# Patient Record
Sex: Female | Born: 1970 | Race: White | Hispanic: Yes | Smoking: Former smoker
Health system: Southern US, Community
[De-identification: ages and names within clinical notes are randomized; demographics above are authoritative.]

## PROBLEM LIST (undated history)

## (undated) DIAGNOSIS — R7303 Prediabetes: Secondary | ICD-10-CM

## (undated) DIAGNOSIS — G039 Meningitis, unspecified: Secondary | ICD-10-CM

## (undated) DIAGNOSIS — I729 Aneurysm of unspecified site: Secondary | ICD-10-CM

## (undated) DIAGNOSIS — E7800 Pure hypercholesterolemia, unspecified: Secondary | ICD-10-CM

## (undated) HISTORY — DX: Meningitis, unspecified: G03.9

## (undated) HISTORY — DX: Aneurysm of unspecified site: I72.9

## (undated) HISTORY — DX: Pure hypercholesterolemia, unspecified: E78.00

## (undated) HISTORY — DX: Prediabetes: R73.03

---

## 1997-10-07 ENCOUNTER — Encounter (HOSPITAL_COMMUNITY): Admission: RE | Admit: 1997-10-07 | Discharge: 1997-10-11 | Payer: Self-pay | Admitting: *Deleted

## 1997-10-08 ENCOUNTER — Inpatient Hospital Stay (HOSPITAL_COMMUNITY): Admission: AD | Admit: 1997-10-08 | Discharge: 1997-10-13 | Payer: Self-pay | Admitting: *Deleted

## 2003-08-19 ENCOUNTER — Emergency Department (HOSPITAL_COMMUNITY): Admission: EM | Admit: 2003-08-19 | Discharge: 2003-08-19 | Payer: Self-pay | Admitting: Emergency Medicine

## 2003-09-03 ENCOUNTER — Emergency Department (HOSPITAL_COMMUNITY): Admission: EM | Admit: 2003-09-03 | Discharge: 2003-09-04 | Payer: Self-pay | Admitting: Emergency Medicine

## 2004-08-05 ENCOUNTER — Emergency Department (HOSPITAL_COMMUNITY): Admission: EM | Admit: 2004-08-05 | Discharge: 2004-08-05 | Payer: Self-pay | Admitting: Emergency Medicine

## 2004-11-27 ENCOUNTER — Encounter (INDEPENDENT_AMBULATORY_CARE_PROVIDER_SITE_OTHER): Payer: Self-pay | Admitting: *Deleted

## 2004-11-27 LAB — CONVERTED CEMR LAB

## 2004-12-06 ENCOUNTER — Ambulatory Visit: Payer: Self-pay | Admitting: Family Medicine

## 2004-12-14 ENCOUNTER — Ambulatory Visit: Payer: Self-pay | Admitting: Family Medicine

## 2005-01-23 ENCOUNTER — Ambulatory Visit: Payer: Self-pay | Admitting: Family Medicine

## 2005-01-25 ENCOUNTER — Ambulatory Visit (HOSPITAL_COMMUNITY): Admission: RE | Admit: 2005-01-25 | Discharge: 2005-01-25 | Payer: Self-pay | Admitting: Internal Medicine

## 2005-02-19 ENCOUNTER — Ambulatory Visit: Payer: Self-pay | Admitting: Family Medicine

## 2005-02-26 ENCOUNTER — Ambulatory Visit: Payer: Self-pay | Admitting: Family Medicine

## 2005-03-05 ENCOUNTER — Ambulatory Visit: Payer: Self-pay | Admitting: Sports Medicine

## 2005-03-19 ENCOUNTER — Ambulatory Visit: Payer: Self-pay | Admitting: Sports Medicine

## 2005-04-23 ENCOUNTER — Ambulatory Visit: Payer: Self-pay | Admitting: Sports Medicine

## 2005-04-30 ENCOUNTER — Ambulatory Visit: Payer: Self-pay | Admitting: Obstetrics & Gynecology

## 2005-05-09 ENCOUNTER — Ambulatory Visit: Payer: Self-pay | Admitting: Family Medicine

## 2005-05-12 ENCOUNTER — Ambulatory Visit: Payer: Self-pay | Admitting: Obstetrics & Gynecology

## 2005-05-12 ENCOUNTER — Inpatient Hospital Stay (HOSPITAL_COMMUNITY): Admission: AD | Admit: 2005-05-12 | Discharge: 2005-05-15 | Payer: Self-pay | Admitting: Obstetrics & Gynecology

## 2005-05-12 ENCOUNTER — Ambulatory Visit: Payer: Self-pay | Admitting: Family Medicine

## 2005-05-18 ENCOUNTER — Inpatient Hospital Stay (HOSPITAL_COMMUNITY): Admission: AD | Admit: 2005-05-18 | Discharge: 2005-05-18 | Payer: Self-pay | Admitting: Family Medicine

## 2005-06-28 ENCOUNTER — Ambulatory Visit: Payer: Self-pay | Admitting: Sports Medicine

## 2006-09-27 ENCOUNTER — Encounter (INDEPENDENT_AMBULATORY_CARE_PROVIDER_SITE_OTHER): Payer: Self-pay | Admitting: *Deleted

## 2007-02-03 ENCOUNTER — Emergency Department (HOSPITAL_COMMUNITY): Admission: EM | Admit: 2007-02-03 | Discharge: 2007-02-03 | Payer: Self-pay | Admitting: Family Medicine

## 2010-12-15 NOTE — Discharge Summary (Signed)
Stephanie Abbott, Stephanie Abbott                ACCOUNT NO.:  [id number]   MEDICAL RECORD NO.:  [id number]          PATIENT TYPE:  INP   LOCATION:  9121                          FACILITY:  [facility]   PHYSICIAN:  Lesly Dukes, M.D. DATE OF BIRTH:  Nov 02, 1970   DATE OF ADMISSION:  05/12/2005  DATE OF DISCHARGE:  05/15/2005                                 DISCHARGE SUMMARY   DISCHARGE DIAGNOSES:  1.  Scheduled repeat cesarean section.  2.  Status post bilateral tubal ligation.   DISCHARGE MEDICATIONS:  1.  Percocet, 1-2 pills q.6h. as needed for pain.  2.  Colace 100 mg p.o. b.i.d.   DISCHARGE INSTRUCTIONS:  The patient was instructed to return to MAU 2-3  days after her discharge for staple removal.   HOSPITAL COURSE:  Ms. Teas is a 40 year old, now G2, P2, who was scheduled  to have a C section on May 17, 2005.  She presented to the MAU in labor  on May 12, 2005.  A C section was scheduled due to prior C section.  Upon her presentation in labor, a decision was made to proceed with C  section at that time.   Please see the operation note for surgical details.   In the post surgical and post natal course, the patient did quite well with  no complications and no fevers and adequate control of pain and was moving  bowels and urinating without difficulty.  At the time of discharge, her  wound had not adequately closed at the time of discharge and thus she was  instructed to return for staple removal in 2-3 days after her discharge.   DISCHARGE LABORATORY DATA:  Blood type was O positive.  Antibody screen was  negative.  Hemoglobin 9.7, hematocrit 29.8, white blood cell count 8.3,  platelets 156.  RPR was nonreactive.  The patient is rubella immune.      Franchot Mimes, MD    ______________________________  Lesly Dukes, M.D.    TV/MEDQ  D:  08/02/2005  T:  08/02/2005  Job:  [id number]

## 2010-12-15 NOTE — Op Note (Signed)
[name], [name] NO.:  [id number]   MEDICAL RECORD NO.:  [id number]          PATIENT TYPE:  INP   LOCATION:  9198                          FACILITY:  [facility]   PHYSICIAN:  Lesly Dukes, M.D. DATE OF BIRTH:  [birth year]   DATE OF PROCEDURE:  05/12/2005  DATE OF DISCHARGE:                                 OPERATIVE REPORT   PREOPERATIVE DIAGNOSES:  ____________.           ______________________________  Lesly Dukes, M.D.     KHL/MEDQ  D:  05/12/2005  T:  05/12/2005  Job:  [id number]

## 2010-12-15 NOTE — Op Note (Signed)
Stephanie Abbott, OATLEY                ACCOUNT NO.:  [id number]   MEDICAL RECORD NO.:  [id number]          PATIENT TYPE:  INP   LOCATION:  9121                          [facility]ACILITY:  [facility]   PHYSICIAN:  Lesly Dukes, M.D. DATE OF BIRTH:  Mar 06, 1971   DATE OF PROCEDURE:  05/12/2005  DATE OF DISCHARGE:  05/15/2005                                 OPERATIVE REPORT   PREOPERATIVE DIAGNOSIS:  A 40 year old, para 1, seen in early labor for  repeat cesarean section and bilateral tubal ligation.   POSTOPERATIVE DIAGNOSIS:  A 40 year old, para 1, seen in early labor for  repeat cesarean section and bilateral tubal ligation.   OPERATION/PROCEDURE:  1.  Repeat low flap transverse cesarean section.  2.  Bilateral tubal ligation.   SURGEON:  Lesly Dukes, M.D.   ASSISTANT:  Caren Griffins, C.N.M.   ANESTHESIA:  Spinal.   PATHOLOGY:  None.   ESTIMATED BLOOD LOSS:  800 mL.   COMPLICATIONS:  None.   DESCRIPTION OF PROCEDURE:  After informed consent was obtained, the patient  was taken to the operating room where spinal anesthesia was found to be  adequate.  The patient was placed in the dorsal lithotomy position with a  leftward tilt.  The abdomen was prepped and draped in the normal sterile  fashion.  Foley was in the bladder.   A Pfannenstiel skin incision was made with the scalpel and carried down to  the underlying layer of fascia. The fascia was incised in the midline and  this incision was extended bilaterally.  The superior and inferior aspects  of the fascial incision were grasped with Kocher clamps, tented up,  dissected off sharply and bluntly from underlying layers of rectus muscles.  The rectus muscles were separated in the midline.  The peritoneum was  entered bluntly and extended with good visualization of the bladder.  The  vesicouterine peritoneum was identified, tented up and entered sharply with  the Metzenbaum scissors.  This incision was extended bilaterally and the  bladder flap was created digitally.  Bladder blade was reinserted.  The  uterine incision was made in a transverse fashion in the lower uterine  segment and extended bilaterally with the bandage scissors.  The baby's head  was delivered without problems and the nose and mouth were suctioned.  The  rest of the baby's body delivered easily.  The cord was clamped and cut and  the baby was handed off to the waiting pediatrician.  Apgars were 9 and 9.  Weight was 7 pounds 6 ounces.  Length was 19-1/2 inches.  Fluid was clear.  The uterus was delivered subcutaneously with three-vessel cord.  The uterus  was cleared of all clots and debris.   The uterine incision was closed with 0 Vicryl in a running locked fashion  and good hemostasis was noted with the uterus off tension.  The gutters were  cleared of all clots and debris and closed with 0 Vicryl in a running locked  fashion.  Good hemostasis was noted.   The isthmic part of each fallopian tube was grasped with a  Babcock clamp and  completely ligated with a Filshie clip.  There was good hemostasis.   The uterine incision was noted to be hemostatic one last time.  The abdomen  was irrigated and noted to be clear of all clots and debris.  The peritoneum  and rectus muscles were hemostatic.  The fascia was closed with 0 Vicryl in  a running  fashion.  Good hemostasis was noted.  The subcutaneous tissue was  copiously irrigated and found to be hemostatic.  The skin was closed with  staples.  The patient tolerated the procedure well.  Sponge, lab,  instrument, and needle counts were x2.  The patient went to the recovery  room in stable condition.           ______________________________  Lesly Dukes, M.D.     KHL/MEDQ  D:  06/06/2005  T:  06/06/2005  Job:  [id number]

## 2018-07-05 ENCOUNTER — Emergency Department (HOSPITAL_COMMUNITY): Payer: Self-pay

## 2018-07-05 ENCOUNTER — Encounter (HOSPITAL_COMMUNITY): Payer: Self-pay | Admitting: Pharmacy Technician

## 2018-07-05 ENCOUNTER — Emergency Department (HOSPITAL_COMMUNITY)
Admission: EM | Admit: 2018-07-05 | Discharge: 2018-07-05 | Disposition: A | Discharge status: Home / Self Care | Payer: Self-pay | Attending: Emergency Medicine | Admitting: Emergency Medicine

## 2018-07-05 ENCOUNTER — Other Ambulatory Visit: Payer: Self-pay

## 2018-07-05 DIAGNOSIS — M79601 Pain in right arm: Secondary | ICD-10-CM | POA: Insufficient documentation

## 2018-07-05 DIAGNOSIS — R51 Headache: Secondary | ICD-10-CM | POA: Insufficient documentation

## 2018-07-05 DIAGNOSIS — R519 Headache, unspecified: Secondary | ICD-10-CM

## 2018-07-05 NOTE — ED Notes (Signed)
Patient transported to X-ray 

## 2018-07-05 NOTE — ED Provider Notes (Signed)
[facility] Provider Note   CSN: [id number] Arrival date & time: 07/05/18  1752     History   Chief Complaint No chief complaint on file.   HPI Clearnce HastenMarlene Schier is a 47 y.o. female.  HPI Patient states she has had a burning discomfort in her head for several days.  She went to see her primary care doctor who treated with ketoconazole shampoo.  Patient states she does not feel like that helped and the burning and pain is more and on the inside.  It is becoming more severe.  It also is rating towards her right arm.  Patient has not taken anything for the pain and discomfort.  She denies any fevers or chills.  No vomiting or diarrhea.  No difficulty with her speech or vision History reviewed. No pertinent past medical history.  There are no active problems to display for this patient.   History reviewed. No pertinent surgical history.   OB History   None      Home Medications    Prior to Admission medications   Not on File    Family History No family history on file.  Social History Social History   Tobacco Use  . Smoking status: Not on file  Substance Use Topics  . Alcohol use: Not on file  . Drug use: Not on file     Allergies   Patient has no known allergies.   Review of Systems Review of Systems  All other systems reviewed and are negative.    Physical Exam Updated Vital Signs BP (!) 149/90   Pulse 92   Temp 98.1 F (36.7 C) (Oral)   Resp 18   SpO2 98%   Physical Exam  Constitutional: She appears well-developed and well-nourished. No distress.  HENT:  Head: Normocephalic and atraumatic.  Right Ear: External ear normal.  Left Ear: External ear normal.  Eyes: Conjunctivae are normal. Right eye exhibits no discharge. Left eye exhibits no discharge. No scleral icterus.  Neck: Neck supple. No tracheal deviation present.  Cardiovascular: Normal rate, regular rhythm and intact distal pulses.  Pulmonary/Chest:  Effort normal and breath sounds normal. No stridor. No respiratory distress. She has no wheezes. She has no rales.  Abdominal: Soft. Bowel sounds are normal. She exhibits no distension. There is no tenderness. There is no rebound and no guarding.  Musculoskeletal: She exhibits no edema or tenderness.  Neurological: She is alert. She has normal strength. No cranial nerve deficit (no facial droop, extraocular movements intact, no slurred speech) or sensory deficit. She exhibits normal muscle tone. She displays no seizure activity. Coordination normal.  Skin: Skin is warm and dry. No rash noted.  Psychiatric: She has a normal mood and affect.  Nursing note and vitals reviewed.    ED Treatments / Results  Labs (all labs ordered are listed, but only abnormal results are displayed) Labs Reviewed - No data to display  EKG None  Radiology Dg Cervical Spine Complete  Result Date: 07/05/2018 CLINICAL DATA:  Neck pain. EXAM: CERVICAL SPINE - COMPLETE 4+ VIEW COMPARISON:  None. FINDINGS: Normal alignment of the cervical vertebral bodies. No acute bony findings or abnormal prevertebral soft tissue swelling. Mild disc disease at C5-6 with mild disc space narrowing and spurring. Mild uncinate spurring changes also at this level with mild bony foraminal encroachment. The C1-2 articulations are maintained.  The lung apices are clear. IMPRESSION: Normal alignment and no acute bony findings. 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  Electronically Signed   By: Rudie Meyer M.D.   On: 07/05/2018 18:47    Procedures Procedures (including critical care time)  Medications Ordered in ED Medications - No data to display   Initial Impression / Assessment and Plan / ED Course  I have reviewed the triage vital signs and the nursing notes.  Pertinent labs & imaging results that were available during my care of the patient were reviewed by me and considered in my medical decision making (see chart for details).     Pt presents with acute headache and arm pain.  No focal neuro deficits.  Sx worsening.  Will ct to evaluate for acute pathology.  Plain films of the neck to assess for any bone abnormalities.  PA Dayton Scrape will follow up on results.  Final Clinical Impressions(s) / ED Diagnoses   Final diagnoses:  Acute nonintractable headache, unspecified headache type  Pain of right upper extremity    ED Discharge Orders    None       Linwood Dibbles, MD 07/05/18 1918

## 2018-07-05 NOTE — ED Notes (Signed)
Patient verbalizes understanding of discharge instructions. Opportunity for questioning and answers were provided. 

## 2018-07-05 NOTE — ED Triage Notes (Signed)
Pt arrives via POV with reports of her head "burning" from the inside out. Pt states the pain radiates to her R arm. Seen by PCP and started on ketoconazole shampoo without improvement.

## 2018-07-05 NOTE — ED Provider Notes (Signed)
Assumed care of patient from Dr. Lynelle DoctorKnapp, see his note for complete history and physical. History of burning pain in her scalp, started on antifungal shampoo. XR c-spine normal, pending CT head. Physical Exam  BP 118/82 (BP Location: Right Arm)   Pulse 71   Temp 98.1 F (36.7 C) (Oral)   Resp 18   SpO2 98%   Physical Exam  ED Course/Procedures     Procedures  MDM  CT report reviewed with Dr. Hyacinth MeekerMiller, patient was given a copy of her CT report and advised to follow-up with her primary care provider in the coming week.  Return to the ER at anytime for new or worsening symptoms.  If exam patient does have tenderness of her frontal scalp both left and right areas without notable scalp changes.  She was advised to take Tylenol and apply warm compresses to the area to help with her discomfort while awaiting follow-up with her PCP.       Jeannie FendMurphy, Kegan Shepardson A, PA-C 07/05/18 2113    Eber HongMiller, Brian, MD 07/06/18 [phone]

## 2018-07-05 NOTE — Discharge Instructions (Addendum)
Follow up with your doctor this week for recheck. Please take your CT report with you for your doctor to review.

## 2018-07-05 NOTE — ED Notes (Signed)
Patient transported to CT 

## 2020-08-04 ENCOUNTER — Emergency Department (HOSPITAL_COMMUNITY)
Admission: EM | Admit: 2020-08-04 | Discharge: 2020-08-05 | Disposition: A | Discharge status: Home / Self Care | Payer: Self-pay | Attending: Emergency Medicine | Admitting: Emergency Medicine

## 2020-08-04 ENCOUNTER — Encounter (HOSPITAL_COMMUNITY): Payer: Self-pay | Admitting: Emergency Medicine

## 2020-08-04 ENCOUNTER — Other Ambulatory Visit: Payer: Self-pay

## 2020-08-04 DIAGNOSIS — M25511 Pain in right shoulder: Secondary | ICD-10-CM | POA: Insufficient documentation

## 2020-08-04 DIAGNOSIS — I729 Aneurysm of unspecified site: Secondary | ICD-10-CM | POA: Insufficient documentation

## 2020-08-04 DIAGNOSIS — R202 Paresthesia of skin: Secondary | ICD-10-CM

## 2020-08-04 NOTE — ED Triage Notes (Signed)
Pt arrives via POV from home with headache, left leg and right arm numbness that began last night. Denies recent falls, weakness on one side. Pt awake, alert, appropriate. Neuro intact, no droop, drift, slurred speech. VAN negative. VSS. NAD at present.

## 2020-08-05 ENCOUNTER — Other Ambulatory Visit: Payer: Self-pay

## 2020-08-05 ENCOUNTER — Other Ambulatory Visit (HOSPITAL_COMMUNITY): Payer: Self-pay | Admitting: Physician Assistant

## 2020-08-05 ENCOUNTER — Emergency Department (HOSPITAL_COMMUNITY): Payer: Self-pay

## 2020-08-05 DIAGNOSIS — I671 Cerebral aneurysm, nonruptured: Secondary | ICD-10-CM

## 2020-08-05 LAB — CBC WITH DIFFERENTIAL/PLATELET
Abs Immature Granulocytes: 0.02 10*3/uL (ref 0.00–0.07)
Basophils Absolute: 0 10*3/uL (ref 0.0–0.1)
Basophils Relative: 1 %
Eosinophils Absolute: 0.1 10*3/uL (ref 0.0–0.5)
Eosinophils Relative: 2 %
HCT: 44 % (ref 36.0–46.0)
Hemoglobin: 14.5 g/dL (ref 12.0–15.0)
Immature Granulocytes: 0 %
Lymphocytes Relative: 20 %
Lymphs Abs: 1.3 10*3/uL (ref 0.7–4.0)
MCH: 28.9 pg (ref 26.0–34.0)
MCHC: 33 g/dL (ref 30.0–36.0)
MCV: 87.8 fL (ref 80.0–100.0)
Monocytes Absolute: 0.7 10*3/uL (ref 0.1–1.0)
Monocytes Relative: 11 %
Neutro Abs: 4.3 10*3/uL (ref 1.7–7.7)
Neutrophils Relative %: 66 %
Platelets: 229 10*3/uL (ref 150–400)
RBC: 5.01 MIL/uL (ref 3.87–5.11)
RDW: 12.6 % (ref 11.5–15.5)
WBC: 6.5 10*3/uL (ref 4.0–10.5)
nRBC: 0 % (ref 0.0–0.2)

## 2020-08-05 LAB — BASIC METABOLIC PANEL
Anion gap: 12 (ref 5–15)
BUN: 9 mg/dL (ref 6–20)
CO2: 26 mmol/L (ref 22–32)
Calcium: 9.7 mg/dL (ref 8.9–10.3)
Chloride: 100 mmol/L (ref 98–111)
Creatinine, Ser: 0.69 mg/dL (ref 0.44–1.00)
GFR, Estimated: >60 mL/min (ref >60)
Glucose, Bld: 108 mg/dL — ABNORMAL HIGH (ref 70–99)
Potassium: 3.8 mmol/L (ref 3.5–5.1)
Sodium: 138 mmol/L (ref 135–145)

## 2020-08-05 LAB — I-STAT BETA HCG BLOOD, ED (MC, WL, AP ONLY): I-stat hCG, quantitative: <5 m[IU]/mL (ref <5)

## 2020-08-05 MED ORDER — IOHEXOL 350 MG/ML SOLN
75.0000 mL | Freq: Once | INTRAVENOUS | Status: AC | PRN
Start: 2020-08-05 — End: 2020-08-05
  Administered 2020-08-05: 75 mL via INTRAVENOUS

## 2020-08-05 NOTE — ED Notes (Signed)
Patient transported to CT 

## 2020-08-05 NOTE — ED Provider Notes (Addendum)
[facility] EMERGENCY DEPARTMENT Provider Note   CSN: [id number] Arrival date & time: 08/04/20  1730     History Chief Complaint  Patient presents with  . Numbness  . Headache    Stephanie Abbott is a 50 y.o. female with no significant past medical history presents to the ED with multiple neurologic complaints.  I reviewed patient's medical record and CT head obtained without contrast 07/05/2018 demonstrated a nonspecific submillimeter calcification in the right anterior sylvian fissure along the course of MCA branches. May or may not represent aneurysm that can be further characterized with CTA or MRA of the head.  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  She is right-hand dominant.  She states that for the past couple of years, she has been experiencing intermittent right shoulder pain worse with certain movements.  Particularly overhead movements and reaching posteriorly.  She also endorses intermittent bilateral hand paresthesias as well as paresthesias involving her left first and second toes.  Patient also notes that she has been experiencing some localized numbness over the posterior aspect of her scalp intermittently.  She states that she has been worked up by her primary care provider without any derangement in her electrolytes.  She had been prescribed vitamin D, without any improvement.  She is frustrated that there is no obvious explanation for her collection of paresthesias.  Patient also states that she has finger joints bigger than others.  She denies any visual deficits, current headache, chest pain or shortness of breath, fevers or chills, precipitating trauma or injury, back or neck pain, or other symptoms.  HPI     No past medical history on file.  There are no problems to display for this patient.   No past surgical history on file.   OB History   No obstetric history on file.     No family history on  file.  Social History   Substance Use Topics  . Alcohol use: Not Currently    Comment: occasional  . Drug use: Not Currently    Home Medications Prior to Admission medications   Not on File    Allergies    Patient has no known allergies.  Review of Systems   Review of Systems  All other systems reviewed and are negative.   Physical Exam Updated Vital Signs BP 134/78 (BP Location: Left Arm)   Pulse 84   Temp 98.9 F (37.2 C) (Oral)   Resp 16   Ht 5' (1.524 m)   Wt 67.1 kg   SpO2 100%   BMI 28.90 kg/m   Physical Exam Vitals and nursing note reviewed. Exam conducted with a chaperone present.  HENT:     Head: Normocephalic and atraumatic.  Eyes:     General: No scleral icterus.    Extraocular Movements: Extraocular movements intact.     Conjunctiva/sclera: Conjunctivae normal.     Pupils: Pupils are equal, round, and reactive to light.     Comments: PERRL and EOM intact. No nystagmus.    Neck:     Comments: No midline cervical TTP. Cardiovascular:     Rate and Rhythm: Normal rate.     Pulses: Normal pulses.     Heart sounds: Normal heart sounds.  Pulmonary:     Effort: Pulmonary effort is normal. No respiratory distress.     Breath sounds: Normal breath sounds.  Musculoskeletal:     Cervical back: Normal range of motion and neck  supple. No rigidity.     Comments: No midline spinal tenderness. Right shoulder: ROM largely intact, mildly limited with overhead posterior movement due to pain symptoms.  No bony tenderness or TTP elsewhere.  No overlying skin changes.  Peripheral pulses intact and symmetric.  Sensation intact throughout. Hands bilaterally: Negative Phalen's and Tinel sign.  Sensation intact throughout. Left foot: First and second toe mildly diminished sensation relative to contralateral foot.  Capillary refill less than 2 seconds.  Pedal pulses intact and symmetric.  ROM and strength fully intact. Enlarged PIP joint third finger right hand.  Skin:     General: Skin is dry.     Capillary Refill: Capillary refill takes less than 2 seconds.  Neurological:     General: No focal deficit present.     Mental Status: She is alert and oriented to person, place, and time.     GCS: GCS eye subscore is 4. GCS verbal subscore is 5. GCS motor subscore is 6.     Cranial Nerves: No cranial nerve deficit.     Sensory: No sensory deficit.     Motor: No weakness.     Coordination: Coordination normal.  Psychiatric:        Mood and Affect: Mood normal.        Behavior: Behavior normal.        Thought Content: Thought content normal.    ED Results / Procedures / Treatments   Labs (all labs ordered are listed, but only abnormal results are displayed) Labs Reviewed  BASIC METABOLIC PANEL - Abnormal; Notable for the following components:      Result Value   Glucose, Bld 108 (*)    All other components within normal limits  CBC WITH DIFFERENTIAL/PLATELET  I-STAT BETA HCG BLOOD, ED ([facility], [facility], [facility] ONLY)    EKG None  Radiology CT Angio Head W or Wo Contrast  Result Date: 08/05/2020 CLINICAL DATA:  Neuro deficit, acute, stroke suspected; history of possible aneurysm, multiple Od paresthesias, headache. EXAM: CT ANGIOGRAPHY HEAD TECHNIQUE: Multidetector CT imaging of the head was performed using the standard protocol during bolus administration of intravenous contrast. Multiplanar CT image reconstructions and MIPs were obtained to evaluate the vascular anatomy. CONTRAST:  46mL OMNIPAQUE IOHEXOL 350 MG/ML SOLN COMPARISON:  Noncontrast head CT 07/05/2018. FINDINGS: CT HEAD Brain: Cerebral volume is normal for age. There is no acute intracranial hemorrhage. No demarcated cortical infarct. No extra-axial fluid collection. No evidence of intracranial mass. No midline shift. Vascular: Redemonstrated 7 mm hyperdense focus within the anterior aspect of the right sylvian fissure (series 5, images 12 and 13). Skull: Normal. Negative for fracture or focal lesion. Sinuses:  Air-fluid levels within the left greater than right maxillary sinuses. Background mild bilateral maxillary sinus mucosal thickening. Air-fluid level within the right sphenoid sinus. Moderate partial opacification of the bilateral ethmoid air cells. Orbits: No mass or acute finding. CTA HEAD Anterior circulation: The intracranial internal carotid arteries are patent. The M1 middle cerebral arteries are patent. No M2 proximal branch occlusion or high-grade proximal stenosis is identified. The anterior cerebral arteries are patent. 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 Precontrast hyperdensity limits evaluation for contrast filling. No other intracranial aneurysm is identified. Posterior circulation: The intracranial vertebral arteries are patent. The basilar artery is patent. The posterior cerebral arteries are patent. Posterior communicating arteries  are hypoplastic or absent bilaterally. Venous sinuses: Within the limitations of contrast timing, no convincing thrombus. Anatomic variants: As described IMPRESSION: CT head: 1. No evidence of acute intracranial abnormality. 2. Bilateral maxillary and ethmoid sinusitis. CTA head: 1. Redemonstrated 7 mm focus of hyperdensity within the anterior right sylvian fissure. 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 Precontrast hyperdensity limits evaluation for flow within the aneurysm. Consider catheter-based angiography for further assessment. 2. No intracranial large vessel occlusion or proximal high-grade arterial stenosis. Electronically Signed   By: Jackey Loge DO   On: 08/05/2020 15:56    Procedures Procedures (including critical care time)  Medications Ordered in  ED Medications  iohexol (OMNIPAQUE) 350 MG/ML injection 75 mL (75 mLs Intravenous Contrast Given 08/05/20 1528)    ED Course  I have reviewed the triage vital signs and the nursing notes.  Pertinent labs & imaging results that were available during my care of the patient were reviewed by me and considered in my medical decision making (see chart for details).  Clinical Course as of 08/05/20 1735  Caleen Essex Aug 05, 2020  1645 I spoke with Dr. Julieanne Cotton who states that patient will need an arteriogram.  He agrees that this can be worked up on an outpatient basis.  He asked that I have the patient call [phone] to schedule appointment for Monday. [GG]  1653 I called  [GG]    Clinical Course User Index [GG] Lorelee New, PA-C   MDM Rules/Calculators/A&P                          Patient is presented to the ED for multiple complaints that are subacute/chronic.  Given that she has joint pain involving her right shoulder and PIP joint third finger right hand, in conjunction with her multiple paresthesias in hands/feet bilaterally, possible suggestion of autoimmune disease such as RA.  She is also endorsing paresthesias over the posterior aspect of her head.  Her collection of symptoms are not particularly concerning for stroke.  She denies any headache at this time.  There are no significant focal neurologic deficits on my exam.  CN II through XII grossly intact.  PERRL and EOM intact without nystagmus.  She had already been treated with vitamin D for her paresthesias, however without improvement.  We will check a CBC and BMP here in the ED.  Additionally, given her possible aneurysm from 2019 on CT, will obtain CTA head here in the ED.  Ultimately will refer to neurology for ongoing evaluation and management.  She likely may also benefit from orthopedic or rheumatology evaluation given her symptoms.  With her right shoulder, initially was concern for thoracic outlet syndrome, however lower  suspicion.  I also considered carpal tunnel syndrome given her bilateral hand burning/paresthesias worse in the morning.  However, do not feel as though she requires splinting at this time.  Negative Phalen's and Tinel test.  Laboratory work-up is personally reviewed entirely unremarkable.  CTA head was obtained and demonstrates no evidence of acute intracranial normalities.  However, there is bilateral maxillary and ethmoid sinusitis.  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  Spoke with neuro  hospitalist, Dr. Derry Lory, who asked that I discuss with Dr. Julieanne Cotton who is on-call specialist for aneurysm coiling.    I spoke with Dr. Julieanne Cotton who states that patient will need an arteriogram.  He agrees that this can be worked up on an outpatient basis.  He asked that I have the patient call [phone] to schedule appointment for Monday.  I called in myself and they will call her to schedule an appointment after I discharge her.    Translator services utilized for the entirety of the history, physical exam, assessment, and plan. ED return precautions discussed.  Patient voices understanding and is agreeable to the plan.  Final Clinical Impression(s) / ED Diagnoses Final diagnoses:  Aneurysm [facility])  Paresthesias    Rx / DC Orders ED Discharge Orders    None       Lorelee New, PA-C 08/05/20 1734    Lorelee New, PA-C 08/05/20 1735    Terald Sleeper, MD 08/05/20 [phone]

## 2020-08-05 NOTE — Discharge Instructions (Addendum)
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  I have lower suspicion that it is contributing to your nonspecific paresthesias here in the ED, however I have spoken with the neurologist and I would like for you to receive an arteriogram (brain scan) on Monday.  You should receive a phone call in the next 30 minutes.  However, if you do not, please call [phone] to schedule an appointment with [facility].    At the very least, depending on outcome of the imaging, I would like for you to follow-up with neurology and possibly rheumatology on an outpatient basis.  Please discuss this further with your primary care provider.  I am concerned about possible rheumatoid arthritis as cause for your collection of symptoms.  Please return to the ED or seek immediate medical attention should you experience any new or worsening symptoms.  Tiene un aneurisma cerebral de aproximadamente 7 mm de tamao. Tengo menos sospechas de que est contribuyendo a sus parestesias inespecficas aqu en el servicio de urgencias, sin embargo, he hablado con [employer] y me gustara que le hicieran un arteriograma (escaneo cerebral) el lunes.  Debera recibir una llamada telefnica en los prximos 30 minutos. Sin embargo, si no lo hace, llame al [phone] para programar una cita con [facility].  [employer], dependiendo del resultado de las imgenes, me gustara que hiciera un seguimiento con neurologa y posiblemente reumatologa de forma ambulatoria. Discuta esto ms a fondo con su proveedor de [employer]. Me preocupa la posible artritis reumatoide como causa de su coleccin de sntomas.  Regrese al servicio de urgencias o busque atencin mdica inmediata si experimenta sntomas nuevos o que empeoran.

## 2020-08-08 ENCOUNTER — Other Ambulatory Visit (HOSPITAL_COMMUNITY): Payer: Self-pay | Admitting: Physician Assistant

## 2020-08-08 ENCOUNTER — Encounter (HOSPITAL_COMMUNITY): Payer: Self-pay

## 2020-08-08 ENCOUNTER — Other Ambulatory Visit: Payer: Self-pay

## 2020-08-08 ENCOUNTER — Ambulatory Visit (HOSPITAL_COMMUNITY)
Admission: RE | Admit: 2020-08-08 | Discharge: 2020-08-08 | Disposition: A | Discharge status: Home / Self Care | Payer: Self-pay | Source: Ambulatory Visit | Attending: Physician Assistant | Admitting: Physician Assistant

## 2020-08-08 ENCOUNTER — Other Ambulatory Visit: Payer: Self-pay | Admitting: Radiology

## 2020-08-08 DIAGNOSIS — I671 Cerebral aneurysm, nonruptured: Secondary | ICD-10-CM

## 2020-08-08 HISTORY — PX: IR US GUIDE VASC ACCESS RIGHT: IMG2390

## 2020-08-08 HISTORY — PX: IR ANGIO VERTEBRAL SEL VERTEBRAL UNI R MOD SED: IMG5368

## 2020-08-08 HISTORY — PX: IR 3D INDEPENDENT WKST: IMG2385

## 2020-08-08 HISTORY — PX: IR ANGIO VERTEBRAL SEL SUBCLAVIAN INNOMINATE UNI L MOD SED: IMG5364

## 2020-08-08 HISTORY — PX: IR ANGIO INTRA EXTRACRAN SEL COM CAROTID INNOMINATE BILAT MOD SED: IMG5360

## 2020-08-08 LAB — BASIC METABOLIC PANEL
Anion gap: 11 (ref 5–15)
BUN: 12 mg/dL (ref 6–20)
CO2: 26 mmol/L (ref 22–32)
Calcium: 9.3 mg/dL (ref 8.9–10.3)
Chloride: 101 mmol/L (ref 98–111)
Creatinine, Ser: 0.73 mg/dL (ref 0.44–1.00)
GFR, Estimated: >60 mL/min (ref >60)
Glucose, Bld: 101 mg/dL — ABNORMAL HIGH (ref 70–99)
Potassium: 3.8 mmol/L (ref 3.5–5.1)
Sodium: 138 mmol/L (ref 135–145)

## 2020-08-08 LAB — PROTIME-INR
INR: 1 (ref 0.8–1.2)
Prothrombin Time: 12.4 seconds (ref 11.4–15.2)

## 2020-08-08 LAB — CBC
HCT: 41.5 % (ref 36.0–46.0)
Hemoglobin: 13.6 g/dL (ref 12.0–15.0)
MCH: 28.8 pg (ref 26.0–34.0)
MCHC: 32.8 g/dL (ref 30.0–36.0)
MCV: 87.9 fL (ref 80.0–100.0)
Platelets: 214 10*3/uL (ref 150–400)
RBC: 4.72 MIL/uL (ref 3.87–5.11)
RDW: 12.4 % (ref 11.5–15.5)
WBC: 4.8 10*3/uL (ref 4.0–10.5)
nRBC: 0 % (ref 0.0–0.2)

## 2020-08-08 MED ORDER — SODIUM CHLORIDE 0.9 % IV SOLN: Freq: Once | INTRAVENOUS | Status: AC

## 2020-08-08 MED ORDER — MIDAZOLAM HCL 2 MG/2ML IJ SOLN
INTRAMUSCULAR | Status: AC | PRN
Start: 2020-08-08 — End: 2020-08-08
  Administered 2020-08-08: 1 mg via INTRAVENOUS

## 2020-08-08 MED ORDER — FENTANYL CITRATE (PF) 100 MCG/2ML IJ SOLN
INTRAMUSCULAR | Status: AC | PRN
  Administered 2020-08-08: 25 ug via INTRAVENOUS

## 2020-08-08 MED ORDER — HEPARIN SODIUM (PORCINE) 1000 UNIT/ML IJ SOLN
INTRAMUSCULAR | Status: AC | PRN
  Administered 2020-08-08: 2000 [IU] via INTRA_ARTERIAL

## 2020-08-08 MED ORDER — SODIUM CHLORIDE 0.9 % IV BOLUS
INTRAVENOUS | Status: AC | PRN
  Administered 2020-08-08: 250 mL via INTRAVENOUS

## 2020-08-08 MED ORDER — LIDOCAINE HCL (PF) 1 % IJ SOLN
INTRAMUSCULAR | Status: AC | PRN
Start: 2020-08-08 — End: 2020-08-08
  Administered 2020-08-08: 10 mL

## 2020-08-08 MED ORDER — VERAPAMIL HCL 2.5 MG/ML IV SOLN
INTRAVENOUS | Status: AC
  Filled 2020-08-08: qty 2

## 2020-08-08 MED ORDER — NITROGLYCERIN 1 MG/10 ML FOR IR/CATH LAB
INTRA_ARTERIAL | Status: AC
  Filled 2020-08-08: qty 10

## 2020-08-08 MED ORDER — HEPARIN SODIUM (PORCINE) 1000 UNIT/ML IJ SOLN
INTRAMUSCULAR | Status: AC
  Filled 2020-08-08: qty 1

## 2020-08-08 MED ORDER — LIDOCAINE HCL 1 % IJ SOLN
INTRAMUSCULAR | Status: AC
  Filled 2020-08-08: qty 20

## 2020-08-08 MED ORDER — VERAPAMIL HCL 2.5 MG/ML IV SOLN
INTRAVENOUS | Status: AC | PRN
  Administered 2020-08-08: 2.5 mg via INTRAVENOUS

## 2020-08-08 MED ORDER — SODIUM CHLORIDE 0.9 % IV SOLN: INTRAVENOUS | Status: AC

## 2020-08-08 MED ORDER — FENTANYL CITRATE (PF) 100 MCG/2ML IJ SOLN
INTRAMUSCULAR | Status: AC
  Filled 2020-08-08: qty 2

## 2020-08-08 MED ORDER — NITROGLYCERIN 1 MG/10 ML FOR IR/CATH LAB
INTRA_ARTERIAL | Status: AC | PRN
  Administered 2020-08-08 (×2): 200 ug via INTRA_ARTERIAL

## 2020-08-08 MED ORDER — MIDAZOLAM HCL 2 MG/2ML IJ SOLN
INTRAMUSCULAR | Status: AC
  Filled 2020-08-08: qty 2

## 2020-08-08 MED ORDER — IOHEXOL 300 MG/ML  SOLN
100.0000 mL | Freq: Once | INTRAMUSCULAR | Status: AC | PRN
  Administered 2020-08-08: 80 mL via INTRA_ARTERIAL

## 2020-08-08 MED ORDER — IOHEXOL 300 MG/ML  SOLN
100.0000 mL | Freq: Once | INTRAMUSCULAR | Status: AC | PRN
  Administered 2020-08-08: 35 mL via INTRA_ARTERIAL

## 2020-08-08 NOTE — Discharge Instructions (Addendum)
Cerebral Angiogram, Care After This sheet gives you information about how to care for yourself after your procedure. Your health care provider may also give you more specific instructions. If you have problems or questions, contact your health care provider. What can I expect after the procedure? After the procedure, it is common to have:  Bruising and tenderness at the catheter insertion site.  A mild headache. Follow these instructions at home: Insertion site care  Follow instructions from your health care provider about how to take care of the insertion site. Make sure you: ? Wash your hands with soap and water before and after you change your bandage (dressing). If soap and water are not available, use hand sanitizer. ? Change your dressing as told by your health care provider.  Do not take baths, swim, or use a hot tub until your health care provider approves. You may shower 24-48 hours after the procedure, or as told by your health care provider.  To clean your insertion site: ? Gently wash the site with plain soap and water. ? Pat the area dry with a clean towel. ? Do not rub the site. This may cause bleeding.  Do not apply powder or lotion to the site. Keep the site clean and dry. Infection signs Check your incision area every day for signs of infection. Check for:  Redness, swelling, or pain.  Fluid or blood.  Warmth.  Pus or a bad smell.   Activity  Do not drive for 24 hours if you were given a sedative during your procedure.  Rest as told by your health care provider.  Do not lift anything that is heavier than 10 lb (4.5 kg), or the limit that you are told, until your health care provider says that it is safe.  Return to your normal activities as told by your health care provider, usually in about a week. Ask your health care provider what activities are safe for you. General instructions  If your insertion site starts to bleed, lie flat and put pressure on the  site. If the bleeding does not stop, get help right away. This is a medical emergency.  Do not use any products that contain nicotine or tobacco, such as cigarettes, e-cigarettes, and chewing tobacco. If you need help quitting, ask your health care provider.  Take over-the-counter and prescription medicines only as told by your health care provider.  Drink enough fluid to keep your urine pale yellow. This helps flush the contrast dye from your body.  Keep all follow-up visits as directed by your health care provider. This is important.   Contact a health care provider if:  You have a fever or chills.  You have redness, swelling, or pain around your insertion site.  You have fluid or blood coming from your insertion site.  The insertion site feels warm to the touch.  You have pus or a bad smell coming from your insertion site.  You notice blood collecting in the tissue around the insertion site (hematoma). The hematoma may be painful to the touch. Get help right away if:  You have chest pain or trouble breathing.  You have severe pain or swelling at the insertion site.  The insertion area bleeds, and bleeding continues after 30 minutes of holding steady pressure on the site.  The arm or leg where the catheter was inserted is pale, cold, numb, tingling, or weak.  You have a rash.  You have any symptoms of a stroke. "BE FAST" is  an easy way to remember the main warning signs of a stroke: ? B - Balance. Signs are dizziness, sudden trouble walking, or loss of balance. ? E - Eyes. Signs are trouble seeing or a sudden change in vision. ? F - Face. Signs are sudden weakness or numbness of the face, or the face or eyelid drooping on one side. ? A - Arms. Signs are weakness or numbness in an arm. This happens suddenly and usually on one side of the body. ? S - Speech. Signs are sudden trouble speaking, slurred speech, or trouble understanding what people say. ? T - Time. Time to call  emergency services. Write down what time symptoms started.  You have other signs of a stroke, such as: ? A sudden, severe headache with no known cause. ? Nausea or vomiting. ? Seizure. These symptoms may represent a serious problem that is an emergency. Do not wait to see if the symptoms will go away. Get medical help right away. Call your local emergency services (911 in the [place] Do not drive yourself to the hospital. Summary  Bruising and tenderness at the insertion site are common.  Follow your health care provider's instructions about caring for your insertion site. Change dressing and clean the area as instructed.  If your insertion site bleeds, apply direct pressure until bleeding stops.  Return to your normal activities as told by your health care provider. Ask what activities are safe.  Rest and drink plenty of fluids. This information is not intended to replace advice given to you by your health care provider. Make sure you discuss any questions you have with your health care provider. Document Revised: 02/03/2019 Document Reviewed: 02/03/2019 Elsevier Patient Education  2021 Elsevier Inc. Radial Site Care  This sheet gives you information about how to care for yourself after your procedure. Your health care provider may also give you more specific instructions. If you have problems or questions, contact your health care provider. What can I expect after the procedure? After the procedure, it is common to have:  Bruising and tenderness at the catheter insertion area. Follow these instructions at home: Medicines  Take over-the-counter and prescription medicines only as told by your health care provider. Insertion site care  Follow instructions from your health care provider about how to take care of your insertion site. Make sure you: ? Wash your hands with soap and water before you change your bandage (dressing). If soap and water are not available, use hand  sanitizer. ? Change your dressing as told by your health care provider. ? Leave stitches (sutures), skin glue, or adhesive strips in place. These skin closures may need to stay in place for 2 weeks or longer. If adhesive strip edges start to loosen and curl up, you may trim the loose edges. Do not remove adhesive strips completely unless your health care provider tells you to do that.  Check your insertion site every day for signs of infection. Check for: ? Redness, swelling, or pain. ? Fluid or blood. ? Pus or a bad smell. ? Warmth.  Do not take baths, swim, or use a hot tub until your health care provider approves.  You may shower 24-48 hours after the procedure, or as directed by your health care provider. ? Remove the dressing and gently wash the site with plain soap and water. ? Pat the area dry with a clean towel. ? Do not rub the site. That could cause bleeding.  Do not apply powder or  lotion to the site. Activity  For 24 hours after the procedure, or as directed by your health care provider: ? Do not flex or bend the affected arm. ? Do not push or pull heavy objects with the affected arm. ? Do not drive yourself home from the hospital or clinic. You may drive 24 hours after the procedure unless your health care provider tells you not to. ? Do not operate machinery or power tools.  Do not lift anything that is heavier than 10 lb (4.5 kg), or the limit that you are told, until your health care provider says that it is safe.  Ask your health care provider when it is okay to: ? Return to work or school. ? Resume usual physical activities or sports. ? Resume sexual activity.   General instructions  If the catheter site starts to bleed, raise your arm and put firm pressure on the site. If the bleeding does not stop, get help right away. This is a medical emergency.  If you went home on the same day as your procedure, a responsible adult should be with you for the first 24 hours  after you arrive home.  Keep all follow-up visits as told by your health care provider. This is important. Contact a health care provider if:  You have a fever.  You have redness, swelling, or yellow drainage around your insertion site. Get help right away if:  You have unusual pain at the radial site.  The catheter insertion area swells very fast.  The insertion area is bleeding, and the bleeding does not stop when you hold steady pressure on the area.  Your arm or hand becomes pale, cool, tingly, or numb. These symptoms may represent a serious problem that is an emergency. Do not wait to see if the symptoms will go away. Get medical help right away. Call your local emergency services (911 in the [place] Do not drive yourself to the hospital. Summary  After the procedure, it is common to have bruising and tenderness at the site.  Follow instructions from your health care provider about how to take care of your radial site wound. Check the wound every day for signs of infection.  Do not lift anything that is heavier than 10 lb (4.5 kg), or the limit that you are told, until your health care provider says that it is safe. This information is not intended to replace advice given to you by your health care provider. Make sure you discuss any questions you have with your health care provider. Document Revised: 08/21/2017 Document Reviewed: 08/21/2017 Elsevier Patient Education  2021 Elsevier Inc. Cuidado del sitio del radio Radial Site Care  Esta hoja le proporciona informacin sobre cmo cuidarse despus del procedimiento. El mdico tambin podr darle instrucciones ms especficas. Comunquese con su mdico si tiene problemas o preguntas. Qu puedo esperar despus del procedimiento? Despus del procedimiento, es comn [employer] siguientes sntomas:  Moretones y Engineer, mining a Heritage manager de insercin del Licensed conveyancer. Siga estas indicaciones en su casa: Medicamentos  [employer] de venta libre y los recetados solamente como se lo haya indicado el mdico. Cuidados de la zona de la insercin  Siga las indicaciones del mdico acerca de los cuidados del Environmental consultant de la insercin. Haga lo siguiente: ? Lvese las manos con agua y jabn antes de Multimedia programmer las vendas (vendajes). Use desinfectante para manos si no dispone de [place] y [place]. ? Cambie los [employer] se lo haya  indicado el mdico. ? No retire los puntos (suturas), la goma para cerrar la piel o las tiras [place]. Es posible que estos cierres cutneos Conservation officer, nature en la piel durante 2semanas o ms. Si los bordes de las tiras [address] empiezan a despegarse y Scientific laboratory technician, puede recortar los que estn sueltos. No retire las tiras [employer] por completo a menos que el mdico se lo indique.  Haematologist de insercin todos los [address]  [address] para descartar signos de infeccin. Est atento a los siguientes signos: ? Dolor, hinchazn o enrojecimiento. ? Lquido o sangre. ? Pus o mal olor. ? Calor.  No tome baos de inmersin, no nade ni use el jacuzzi hasta que el mdico lo autorice.  Puede ducharse entre 24y 48horas despus del procedimiento o como se lo haya indicado el mdico. ? Retire el vendaje y lave suavemente el lugar de la insercin con agua y jabn comn. ? Seque bien el rea con una toalla limpia dando golpecitos. ? No se frote el lugar. Al hacerlo, puede ocasionar sangrado.  No se aplique talcos ni lociones en el lugar. Actividad  Durante las 24horas posteriores al procedimiento o segn las indicaciones del mdico: ? No flexione ni doble el brazo afectado. ? No empuje ni jale objetos pesados con el brazo afectado. ? No conduzca a su casa desde la clnica o el hospital. Puede conducir 24horas despus del procedimiento, a menos que el mdico le haya indicado lo contrario. ? No opere maquinaria ni herramientas elctricas.  No levante ningn objeto que pese ms de 10libras (4,5kg) o el  lmite de peso que le hayan indicado, hasta que el mdico le diga que puede [place].  Pregntele al mdico cundo puede hacer lo siguiente: ? Regresar a la escuela o al Aleen Campi. ? Reanudar las actividades fsicas o los deportes que practica habitualmente. ? Reanudar la actividad sexual.   Instrucciones generales  Si el lugar de la insercin del catter comienza a Geophysicist/field seismologist, levante el brazo y Airline pilot presin en el lugar con firmeza. Si la hemorragia no se detiene, pida ayuda de inmediato. Esto es Engineer, drilling.  Si regres a Sales promotion account executive que se Chief Strategy Officer procedimiento, un adulto responsable debe acompaarlo durante las primeras 24horas posteriores a la llegada a su casa.  Concurra a todas las visitas de control como se lo haya indicado el mdico. Esto es importante. Comunquese con un mdico si:  Tiene fiebre.  Tiene enrojecimiento, hinchazn o una secrecin amarillenta alrededor del lugar de la insercin. Solicite ayuda de inmediato si:  Tiene un dolor que no es habitual en el sitio del radio.  La zona de insercin del catter se hincha muy rpido.  La zona de insercin sangra y el sangrado no se detiene cuando ejerce presin constante en la zona.  El brazo o la mano se le ponen plidos, fros o siente hormigueo o adormecimiento. Estos sntomas pueden representar un problema grave que constituye Radio broadcast assistant. No espere hasta que los sntomas desaparezcan. Solicite atencin mdica de inmediato. Comunquese con el servicio de emergencias de su localidad (911 en los [place]). No conduzca por sus propios medios [employer]. Resumen  Despus del procedimiento, es frecuente tener moretones y Financial risk analyst a la Art therapist de insercin del Licensed conveyancer.  Siga las instrucciones del mdico acerca del cuidado de la herida en Immunologist de insercin radial. Observe la herida todos los [place] para descartar signos de infeccin.  No levante ningn objeto que pese  ms  de 10libras (4,5kg) o el lmite de peso que le hayan indicado, hasta que el mdico le diga que puede [place]. Esta informacin no tiene Theme park manager el consejo del mdico. Asegrese de hacerle al mdico cualquier pregunta que tenga. Document Revised: 09/23/2017 Document Reviewed: 09/23/2017 Elsevier Patient Education  2021 Elsevier Inc. Angiograma cerebral, cuidados posteriores Cerebral Angiogram, Care After Esta hoja le brinda informacin sobre cmo cuidarse despus del procedimiento. Su mdico tambin podr darle instrucciones ms especficas. Comunquese con el mdico si tiene problemas o preguntas. Qu puedo esperar despus del procedimiento? Despus del procedimiento, es normal tener los siguientes sntomas:  Moretones y Engineer, mining a Heritage manager de insercin del Licensed conveyancer.  Dolor de cabeza leve. Siga estas instrucciones en su casa: Cuidados del lugar de la insercin  Siga las instrucciones del mdico acerca de los cuidados del lugar de la insercin. Asegrese de hacer lo siguiente: ? Lvese las manos con agua y [place] antes y despus de cambiar la venda (vendaje). Use desinfectante para manos si no dispone de [place] y [place]. ? Cambie el vendaje como se lo haya indicado el mdico.  No tome baos de inmersin, no nade ni use el jacuzzi hasta que el mdico lo autorice. Puede ducharse 24a 48horas despus del procedimiento o como se lo haya indicado el mdico.  Para limpiar el lugar de la insercin: ? Actuary con agua y jabn comn. ? Seque bien el rea con una toalla limpia dando golpecitos. ? No se frote el lugar. Esto puede ocasionarle una hemorragia.  No se aplique talcos ni lociones en el lugar. Mantenga el lugar limpio y Dealer. Signos de infeccin Controle la zona de la incisin todos los das para detectar signos de infeccin. Est atento a los siguientes signos:  Enrojecimiento, Optician, dispensing.  Lquido o sangre.  Calor.  Pus o  mal olor.   Actividad  No conduzca durante 24horas si le administraron un sedante durante el procedimiento.  Haga reposo como se lo haya indicado el mdico.  No levante ningn objeto que pese ms de 10libras (4.5kg) o que supere el lmite de peso que le hayan indicado, hasta que el mdico le diga que puede [place].  Reanude sus actividades normales como se lo haya indicado el mdico; por lo general, dentro de una semana. Pregntele al mdico qu actividades son seguras para usted. Instrucciones generales  Si el lugar de la insercin comienza a Geophysicist/field seismologist, recustese y [place] presin sobre l. Si la hemorragia no se detiene, pida ayuda de inmediato. Esto es Engineer, drilling.  No consuma ningn producto que contenga nicotina o tabaco, como cigarrillos, cigarrillos electrnicos y tabaco de Theatre manager. Si necesita ayuda para dejar de fumar, consulte al [employer].  Use los medicamentos de venta libre y los recetados solamente como se lo haya indicado el mdico.  Beba suficiente lquido como para Pharmacologist la orina de color amarillo plido. [employer] modo, eliminar el tinte del cuerpo.  Concurra a todas las visitas de [address] se lo haya indicado el mdico. Esto es importante.   Comunquese con un mdico si:  Tiene fiebre o escalofros.  Tiene enrojecimiento, hinchazn o dolor alrededor del lugar de la insercin.  Tiene lquido o sangre que emana del lugar de la insercin.  El lugar de la insercin est caliente al tacto.  Tiene pus o percibe mal olor que proviene del lugar de la insercin.  Nota acumulacin de sangre en el tejido que se encuentra alrededor del Environmental consultant  de la insercin (hematoma). Es posible que sienta dolor en el hematoma cuando se lo toca. Solicite ayuda de inmediato si:  Tiene dolor en el pecho o dificultad para respirar.  Tiene hinchazn o dolor intenso en el lugar de la insercin.  El Environmental consultantlugar de la insercin [place]ra, y el sangrado no se detiene despus de 30  minutos de Occupational hygienistejercer una presin constante.  El brazo o la pierna donde se insert el catter est plido, fro, adormecido, con hormigueo o dbil.  Tiene una erupcin cutnea.  Tiene sntomas de un accidente cerebrovascular. "BE FAST" es una manera fcil de recordar los principales signos de advertencia de un accidente cerebrovascular: ? B - Balance (equilibrio). Los signos son mareos, dificultad repentina para caminar o prdida del equilibrio. ? E - Eyes (ojos). Los signos son problemas para ver o un cambio repentino en la visin. ? F - Face (rostro). Los signos son debilidad repentina o adormecimiento del rostro, o el rostro o el prpado que se caen hacia un lado. ? A - Arms (brazos). Los signos son debilidad o adormecimiento en un brazo. Esto sucede de repente y generalmente en un lado del cuerpo. ? S - Speech (habla). Los signos son dificultad para hablar, hablar arrastrando las palabras o dificultad para comprender lo que la gente dice. ? T - Time (tiempo). Es tiempo de llamar al servicio de Sports administratoremergencias. Anote la hora a la que Albertson'scomenzaron los sntomas.  Presenta otros signos de un accidente cerebrovascular, como los siguientes: ? Dolor de cabeza sbito e intenso que no tiene causa aparente. ? Nuseas o vmitos. ? Convulsiones. Estos sntomas pueden representar un problema grave que constituye Radio broadcast assistantuna emergencia. No espere a ver si los sntomas desaparecen. Solicite atencin mdica de inmediato. Comunquese con el servicio de emergencias de su localidad (911 en los [place]). No conduzca por sus propios medios Dollar Generalhasta el hospital. Resumen  Es frecuente tener moretones y Financial risk analystsentir dolor a la palpacin en el lugar de la insercin.  Siga las instrucciones del mdico acerca de cmo cuidar el lugar de la insercin. Cambie el vendaje y limpie la zona segn las instrucciones.  Si el lugar de la insercin sangra, aplique presin directa hasta que el sangrado se Irwintondetenga.  Retome sus actividades  normales como se lo haya indicado el mdico. Pregunte qu actividades son seguras.  Haga reposo y beba mucho lquido. Esta informacin no tiene Theme park managercomo fin reemplazar el consejo del mdico. Asegrese de hacerle al mdico cualquier pregunta que tenga. Document Revised: 04/01/2019 Document Reviewed: 04/01/2019 Elsevier Patient Education  2021 Elsevier Inc.  Sedacin consciente moderada en los adultos Moderate Conscious Sedation, Adult La sedacin es el uso de medicamentos para favorecer la relajacin y Acupuncturistaliviar el malestar y la ansiedad. La sedacin consciente moderada es un tipo de sedacin. Cuando se somete a una sedacin consciente moderada, disminuye su nivel de alerta habitual, pero igualmente puede responder a las instrucciones o al tacto, o a ambos. La sedacin consciente moderada se [place]a durante procedimientos mdicos y dentales breves. Es ms leve que una sedacin profunda, que es un tipo de sedacin de la que no es fcil despertarse. Tambin es ms leve que la anestesia general, que es el uso de medicamentos para dejarlo inconsciente. La sedacin consciente moderada le permite volver a sus actividades habituales ms pronto. Informe al mdico acerca de lo siguiente: Cualquier alergia que tenga. Todos los [place] Energymedicamentos que [place]ausa, incluidos vitaminas, hierbas, gotas oftlmicas, cremas y [address]medicamentos de [address]vent[address] libre. Uso de corticoesteroides. Esto incluye corticoesteroides  por va oral o en crema. Cualquier problema que usted o sus familiares hayan tenido con sedantes y anestsicos. Cualquier trastorno de la sangre que tenga. Cirugas a las que se haya sometido. Cualquier afeccin que padezca, como apnea del sueo. Si est embarazada o podra estarlo. Consumo de cigarrillos, alcohol, marihuana o drogas. Cules son los riesgos? En general, se trata de un procedimiento seguro. Sin embargo, pueden ocurrir complicaciones, por ejemplo: Recibir demasiado medicamento (sedacin  excesiva). Nuseas. Reaccin alrgica a un medicamento. Dificultad para respirar. Si esto ocurre, es posible que se utilice un tubo respiratorio. El tubo se retirar cuando despierte y respire por su cuenta. Problemas cardacos. Problemas pulmonares. Confusin que mejora con el tiempo (delirio de Associate Professoremergencia). Qu ocurre antes del procedimiento? Mantenerse hidratado Siga las instrucciones del mdico acerca de mantenerse hidratado, las cuales pueden incluir lo siguiente: CIT GroupHasta dos horas antes del procedimiento, puede beber lquidos transparentes, como agua, jugos de fruta sin pulpa, caf negro y t solo. Restricciones en las comidas y bebidas Siga las instrucciones del mdico respecto de las restricciones de comidas o bebidas, las cuales pueden incluir lo siguiente: Ocho horas antes del procedimiento, deje de ingerir comidas o alimentos pesados, como carne, alimentos fritos o alimentos grasos. Seis horas antes del procedimiento, deje de ingerir comidas o alimentos livianos, como tostadas o cereales. Seis horas antes del procedimiento, deje de beber [place]leche o bebidas que ConocoPhillipscontengan leche. Dos horas antes del procedimiento, deje de beber lquidos transparentes. Medicamentos Consulte al mdico sobre: Multimedia programmerCambiar o suspender los medicamentos que [place]a habitualmente. Esto es muy importante si toma medicamentos para la diabetes o anticoagulantes. Tomar medicamentos como aspirina e ibuprofeno. Estos medicamentos pueden tener un efecto anticoagulante en la [place]angre. No tome estos medicamentos a menos que el mdico se lo indique. Usar medicamentos de venta libre, vitaminas, hierbas y suplementos. Pruebas y exmenes Le harn un examen fsico. Es posible que se le realicen anlisis de sangre para Development worker, communityevaluar lo siguiente: El funcionamiento de los riones y English as a second language teacherel hgado. Cogulos de [place]angre. Instrucciones generales Pida que un adulto responsable lo lleve a su casa desde el hospital o la clnica. Si va a marcharse a su  casa inmediatamente despus del procedimiento, pdale a un adulto responsable que lo cuide durante el tiempo que le indiquen. Esto es importante. Qu ocurre durante el procedimiento? Le administrarn el sedante. El sedante se podr Building services engineeradministrar de las siguientes formas: En forma de comprimido que deber tragar. Tambin puede introducirse en el recto. Como un aerosol por la Clinical cytogeneticistnariz. Mediante una inyeccin en un msculo. Mediante inyeccin en una vena a travs de una va intravenosa. Pueden administrarle oxgeno segn sea necesario. Durante el procedimiento, le controlarn la respiracin, la frecuencia cardaca y la presin arterial. Se realizar el procedimiento mdico o dental. El procedimiento puede variar segn el mdico y el hospital.   Ladell HeadsQu ocurre despus del procedimiento? Le controlarn la presin arterial, la frecuencia cardaca, la frecuencia respiratoria y Air cabin crewel nivel de oxgeno en la sangre hasta que le den el alta del hospital o la clnica. Recibir lquidos por va intravenosa segn sea necesario. No conduzca ni opere maquinaria hasta que el mdico le indique que es seguro [place]. Resumen La sedacin es el uso de medicamentos para favorecer la relajacin y Acupuncturistaliviar el malestar y la ansiedad. La sedacin consciente moderada es un tipo de sedacin que se [place] durante procedimientos mdicos y Barrister's clerkdentales breves. Informe al mdico sobre cualquier afeccin mdica que tenga y acerca de todos los medicamentos est usando para tratarla. Recibir  el sedante en forma de comprimido, como aerosol por la Clinical cytogeneticist, en una inyeccin en el msculo o una inyeccin en la vena a travs de una va intravenosa. Durante la sedacin, se controlan los signos vitales. La sedacin consciente moderada le permite volver a sus actividades habituales ms pronto. Esta informacin no tiene Theme park manager el consejo del mdico. Asegrese de hacerle al mdico cualquier pregunta que tenga. Document Revised: 01/25/2020  Document Reviewed: 01/25/2020 Elsevier Patient Education  2021 [employer].

## 2020-08-08 NOTE — H&P (Cosign Needed)
Chief Complaint: Patient was seen in consultation today for cerebral arteriogram at the request of Green,Garrett L  Referring Physician(s): Green,Garrett L  Supervising Physician: Julieanne Cotton  Patient Status: [facility] - Out-pt  History of Present Illness: Stephanie Abbott is a 50 y.o. female   Was seen in ED 08/05/20 Neurologic sxs of: Bilat hand numbess and Bilat foot numbness These symptoms are off and on and for months Noticed back of scalp numbness more recently  She states these symptoms have not been bothersome in last few days since ED visit Denies headache; speech or vision changes Denies N/V   CTA head: 1. Redemonstrated 7 mm focus of hyperdensity within the anterior right sylvian fissure. 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 Precontrast hyperdensity limits evaluation for flow within the aneurysm. Consider catheter-based angiography for further assessment. 2. No intracranial large vessel occlusion or proximal high-grade arterial stenosis.   Referred to Dr Corliss Skains for cerebral arteriogram evaluation Possible management/treatment if needed.   History reviewed. No pertinent past medical history.  History reviewed. No pertinent surgical history.  Allergies: Patient has no known allergies.  Medications: Prior to Admission medications   Not on File     History reviewed. No pertinent family history.  Social History   Socioeconomic History  . Marital status: Married    Spouse name: Not on file  . Number of children: Not on file  . Years of education: Not on file  . Highest education level: Not on file  Occupational History  . Not on file  Tobacco Use  . Smoking status: Not on file  . Smokeless tobacco: Not on file  Substance and Sexual Activity  . Alcohol use: Not Currently    Comment: occasional  . Drug use: Not  Currently  . Sexual activity: Not on file  Other Topics Concern  . Not on file  Social History Narrative  . Not on file   Social Determinants of Health   Financial Resource Strain: Not on file  Food Insecurity: Not on file  Transportation Needs: Not on file  Physical Activity: Not on file  Stress: Not on file  Social Connections: Not on file    Review of Systems: A 12 point ROS discussed and pertinent positives are indicated in the HPI above.  All other systems are negative.  Review of Systems  Constitutional: Negative for activity change, fatigue and fever.  HENT: Negative for tinnitus.   Eyes: Negative for visual disturbance.  Respiratory: Negative for cough and shortness of breath.   Cardiovascular: Negative for chest pain.  Gastrointestinal: Negative for abdominal pain, nausea and vomiting.  Musculoskeletal: Negative for back pain and gait problem.  Neurological: Positive for numbness. Negative for dizziness, tremors, seizures, syncope, facial asymmetry, speech difficulty, weakness, light-headedness and headaches.  Psychiatric/Behavioral: Negative for behavioral problems and confusion.    Vital Signs: BP 126/86   Pulse 87   Temp 98.7 F (37.1 C) (Oral)   Resp 16   LMP 03/08/2020   SpO2 100%   Physical Exam Vitals reviewed.  HENT:     Mouth/Throat:     Mouth: Mucous membranes are moist.  Cardiovascular:     Rate and Rhythm: Normal rate and regular rhythm.     Heart sounds: Normal heart sounds.  Pulmonary:     Effort: Pulmonary effort is normal.     Breath sounds: Normal breath sounds.  Abdominal:     Palpations: Abdomen is soft.  Musculoskeletal:        General: Normal range of motion.  Skin:    General: Skin is warm.  Neurological:     Mental Status: She is alert and oriented to person, place, and time.  Psychiatric:        Behavior: Behavior normal.     Imaging: CT Angio Head W or Wo Contrast  Result Date: 08/05/2020 CLINICAL DATA:  Neuro  deficit, acute, stroke suspected; history of possible aneurysm, multiple Od paresthesias, headache. EXAM: CT ANGIOGRAPHY HEAD TECHNIQUE: Multidetector CT imaging of the head was performed using the standard protocol during bolus administration of intravenous contrast. Multiplanar CT image reconstructions and MIPs were obtained to evaluate the vascular anatomy. CONTRAST:  66mL OMNIPAQUE IOHEXOL 350 MG/ML SOLN COMPARISON:  Noncontrast head CT 07/05/2018. FINDINGS: CT HEAD Brain: Cerebral volume is normal for age. There is no acute intracranial hemorrhage. No demarcated cortical infarct. No extra-axial fluid collection. No evidence of intracranial mass. No midline shift. Vascular: Redemonstrated 7 mm hyperdense focus within the anterior aspect of the right sylvian fissure (series 5, images 12 and 13). Skull: Normal. Negative for fracture or focal lesion. Sinuses: Air-fluid levels within the left greater than right maxillary sinuses. Background mild bilateral maxillary sinus mucosal thickening. Air-fluid level within the right sphenoid sinus. Moderate partial opacification of the bilateral ethmoid air cells. Orbits: No mass or acute finding. CTA HEAD Anterior circulation: The intracranial internal carotid arteries are patent. The M1 middle cerebral arteries are patent. No M2 proximal branch occlusion or high-grade proximal stenosis is identified. The anterior cerebral arteries are patent. 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 Precontrast hyperdensity limits evaluation for contrast filling. No other intracranial aneurysm is identified. Posterior circulation: The intracranial vertebral arteries are patent. The basilar artery is patent. The posterior cerebral arteries are patent. Posterior communicating arteries are hypoplastic or absent  bilaterally. Venous sinuses: Within the limitations of contrast timing, no convincing thrombus. Anatomic variants: As described IMPRESSION: CT head: 1. No evidence of acute intracranial abnormality. 2. Bilateral maxillary and ethmoid sinusitis. CTA head: 1. Redemonstrated 7 mm focus of hyperdensity within the anterior right sylvian fissure. 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 Precontrast hyperdensity limits evaluation for flow within the aneurysm. Consider catheter-based angiography for further assessment. 2. No intracranial large vessel occlusion or proximal high-grade arterial stenosis. Electronically Signed   By: Jackey Loge DO   On: 08/05/2020 15:56    Labs:  CBC: Recent Labs    08/05/20 1326 08/08/20 1203  WBC 6.5 4.8  HGB 14.5 13.6  HCT 44.0 41.5  PLT 229 214    COAGS: Recent Labs    08/08/20 1203  INR 1.0    BMP: Recent Labs    08/05/20 1326 08/08/20 1203  NA 138 138  K 3.8 3.8  CL 100 101  CO2 26 26  GLUCOSE 108* 101*  BUN 9 12  CALCIUM 9.7 9.3  CREATININE 0.69 0.73  GFRNONAA >60 >60    LIVER FUNCTION TESTS: No results for input(s): BILITOT, AST, ALT, ALKPHOS, PROT, ALBUMIN in the last 8760 hours.  TUMOR MARKERS: No results for input(s): AFPTM, CEA, CA199, CHROMGRNA in the last 8760  hours.  Assessment and Plan:  Bilat hand and feet numbness off and on for months Posterior scalp numbness recently Symptoms now resolved To ED 1/7 Imaging revealing possible cerebral aneurysm Scheduled now for cerebral arteriogram Risks and benefits of cerebral angiogram with intervention were discussed with the patient including, but not limited to bleeding, infection, vascular injury, contrast induced renal failure, stroke or even death.  This interventional procedure involves the use of X-rays and because of the nature of the planned procedure,  it is possible that we will have prolonged use of X-ray fluoroscopy.  Potential radiation risks to you include (but are not limited to) the following: - A slightly elevated risk for cancer  several years later in life. This risk is typically less than 0.5% percent. This risk is low in comparison to the normal incidence of human cancer, which is 33% for women and 50% for men according to the American Cancer Society. - Radiation induced injury can include skin redness, resembling a rash, tissue breakdown / ulcers and hair loss (which can be temporary or permanent).   The likelihood of either of these occurring depends on the difficulty of the procedure and whether you are sensitive to radiation due to previous procedures, disease, or genetic conditions.   IF your procedure requires a prolonged use of radiation, you will be notified and given written instructions for further action.  It is your responsibility to monitor the irradiated area for the 2 weeks following the procedure and to notify your physician if you are concerned that you have suffered a radiation induced injury.    All of the patient's questions were answered, patient is agreeable to proceed.  Consent signed and in chart.   Thank you for this interesting consult.  I greatly enjoyed meeting Stephanie Abbott and look forward to participating in their care.  A copy of this report was sent to the requesting provider on this date.  Electronically Signed: Robet Leu, PA-C 08/08/2020, 1:16 PM   I spent a total of  30 Minutes   in face to face in clinical consultation, greater than 50% of which was counseling/coordinating care for cerebral arteriogram

## 2020-08-08 NOTE — Procedures (Addendum)
S/P 4 vessel cerebral arteriogram RT rad approach. Findings. 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

## 2020-08-09 ENCOUNTER — Other Ambulatory Visit (HOSPITAL_COMMUNITY): Payer: Self-pay | Admitting: Physician Assistant

## 2020-08-09 DIAGNOSIS — I671 Cerebral aneurysm, nonruptured: Secondary | ICD-10-CM

## 2020-09-06 NOTE — Progress Notes (Signed)
Office Visit Note  Patient: Stephanie Abbott             Date of Birth: 08-28-1970           MRN: [id number]             PCP: Sandre Kitty, PA-C Referring: Sandre Kitty, PA-C Visit Date: 09/07/2020   Subjective:  New Patient (Initial Visit) (Patient complains of right sided shoulder and neck pain as well as radiation into the right arm. Patient also complains of back pain with radiation to the left leg/foot. )   History of Present Illness: Stephanie Abbott is a 50 y.o. female here for evaluation of shoulder pain also paresthesias of multiple sites including the back of the scalp, hands, feet, others intermittently. 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 She also describes some enlargement of finger joints, and reduced shoulder ROM on account of pain. Currently her worst problem is the right shoulder pain and often numbness radiating down the whole arm. This is provoked with some movements and if lying onto her right side at night. She has not noticed swelling or visible changes in the arm while affected. Her back pain with radiation down the left leg is less problematic than this shoulder issue. She has tingling and numbness type sensation on the scalp worst around the crown of the head. She has also noticed some hair thinning on her scalp.   Patient is Spanish speaking and encounter performed with assistance of interpreter.  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   Activities of Daily Living:  Patient reports morning stiffness for 0 minutes.   Patient Reports nocturnal pain.  Difficulty dressing/grooming: Denies Difficulty climbing stairs: Denies Difficulty getting out of chair: Denies Difficulty using hands for taps, buttons, cutlery, and/or writing:  Reports  Review of Systems  Constitutional: Positive for fatigue.  HENT: Positive for mouth dryness. Negative for mouth sores and nose dryness.   Eyes: Positive for itching, visual disturbance and dryness. Negative for pain.  Respiratory: Positive for shortness of breath and difficulty breathing. Negative for cough and hemoptysis.   Cardiovascular: Positive for palpitations. Negative for chest pain and swelling in legs/feet.  Gastrointestinal: Negative for abdominal pain, blood in stool, constipation and diarrhea.  Endocrine: Negative for increased urination.  Genitourinary: Negative for painful urination.  Musculoskeletal: Positive for arthralgias, joint pain, myalgias and myalgias. Negative for joint swelling, muscle weakness, morning stiffness and muscle tenderness.  Skin: Negative for color change, rash and redness.  Allergic/Immunologic: Negative for susceptible to infections.  Neurological: Positive for dizziness, numbness, headaches and weakness. Negative for memory loss.  Hematological: Positive for swollen glands.  Psychiatric/Behavioral: Negative for confusion and sleep disturbance.    PMFS History:  Patient Active Problem List   Diagnosis Date Noted  . Pain in right shoulder 09/07/2020  . Scalp pain 09/07/2020  . Pain in left leg 09/07/2020    Past Medical History:  Diagnosis Date  . Aneurysm (HCC)   . High cholesterol   . Meningitis   . Pre-diabetes     Family History  Problem Relation Age of Onset  . Diabetes Mother   . Healthy Daughter   . Healthy Son    Past Surgical History:  Procedure Laterality Date  . CESAREAN SECTION  1999  . CESAREAN SECTION  2006  . IR 3D INDEPENDENT WKST  08/08/2020  . IR ANGIO INTRA EXTRACRAN SEL COM CAROTID INNOMINATE BILAT MOD SED  08/08/2020  . IR ANGIO VERTEBRAL SEL SUBCLAVIAN INNOMINATE UNI L MOD SED  08/08/2020  . IR ANGIO VERTEBRAL SEL VERTEBRAL UNI R MOD SED  08/08/2020  . IR US GUIDE VASC ACCESS RIGHT  08/08/2020   Social  History   Social History Narrative  . Not on file   Immunization History  Administered Date(s) Administered  . PFIZER(Purple Top)SARS-COV-2 Vaccination 12/10/2019, 01/04/2020     Objective: Vital Signs: BP 107/70 (BP Location: Left Arm, Patient Position: Sitting, Cuff Size: Normal)   Pulse 76   Ht 5' (1.524 m)   Wt 147 lb (66.7 kg)   BMI 28.71 kg/m    Physical Exam HENT:     Head: Normocephalic.     Right Ear: External ear normal.     Left Ear: External ear normal.     Mouth/Throat:     Mouth: Mucous membranes are moist.     Pharynx: Oropharynx is clear.  Eyes:     Conjunctiva/sclera: Conjunctivae normal.  Cardiovascular:     Rate and Rhythm: Normal rate and regular rhythm.  Pulmonary:     Effort: Pulmonary effort is normal.     Breath sounds: Normal breath sounds.  Skin:    General: Skin is warm and dry.     Findings: No rash.     Comments: Mild frontal hair thinning or recession  Neurological:     General: No focal deficit present.     Mental Status: She is alert.  Psychiatric:        Mood and Affect: Mood normal.     Musculoskeletal Exam:  Neck full ROM, mild right sided tenderness at base Shoulders right pain anteriorly with internal rotation while abducted, painful neer's, left shoulder normal Elbows full ROM no tenderness or swelling Wrists full ROM no tenderness or swelling Fingers full ROM no tenderness or swelling Hip normal internal and external rotation without pain, no tenderness to lateral hip palpation, left leg pain provoked with SLR Knees full ROM no tenderness or swelling Ankles full ROM no tenderness or swelling   Investigation: No additional findings.  Imaging: No results found.  Recent Labs: Lab Results  Component Value Date   WBC 4.8 08/08/2020   HGB 13.6 08/08/2020   PLT 214 08/08/2020   NA 138 08/08/2020   K 3.8 08/08/2020   CL 101 08/08/2020   CO2 26 08/08/2020   GLUCOSE 101 (H) 08/08/2020   BUN 12 08/08/2020    CREATININE 0.73 08/08/2020   CALCIUM 9.3 08/08/2020    Speciality Comments: No specialty comments available.  Procedures:  No procedures performed Allergies: Patient has no known allergies.   Assessment / Plan:     Visit Diagnoses: Right shoulder pain, unspecified chronicity  Right shoulder pain sounds most consistent with impingement syndrome. Painful maneuvers suggest rotator cuff as source of pain no specific history of trauma or acute onset. Discussed treatment option including possible injection or imaging, at this time will try conservative approach with PRN NSAIDs and provided stretching exercises for the shoulder. If not improving within 4 wks, would consider imaging and trial of steroid injection.  Scalp pain  Not clear on cause, no visible changes on exam. Distribution is atypical for occipital neuralgia but possible. Could be related to tension with hair style may be suggested with the frontal thinning so recommended trying to adjust this and f/u for  now.  Pain in left leg  Left leg pain does sound like sciatica not sure if coming from lumbar spine impingement or maybe piriformis syndrome kind of problem. Currently this bothers her less than the shoulder so okay just to observe no imaging at this time.  Orders: No orders of the defined types were placed in this encounter.  No orders of the defined types were placed in this encounter.    Follow-Up Instructions: Return in about 4 weeks (around 10/05/2020) for Paresthesias, joint pain f/u.   Fuller Plan, MD  Note - This record has been created using [employer].  Chart creation errors have been sought, but may not always  have been located. Such creation errors do not reflect on  the standard of medical care.

## 2020-09-07 ENCOUNTER — Other Ambulatory Visit: Payer: Self-pay

## 2020-09-07 ENCOUNTER — Encounter: Payer: Self-pay | Admitting: Internal Medicine

## 2020-09-07 ENCOUNTER — Encounter (INDEPENDENT_AMBULATORY_CARE_PROVIDER_SITE_OTHER): Payer: Self-pay

## 2020-09-07 ENCOUNTER — Ambulatory Visit (INDEPENDENT_AMBULATORY_CARE_PROVIDER_SITE_OTHER): Payer: Self-pay | Admitting: Internal Medicine

## 2020-09-07 DIAGNOSIS — R519 Headache, unspecified: Secondary | ICD-10-CM | POA: Insufficient documentation

## 2020-09-07 DIAGNOSIS — M79605 Pain in left leg: Secondary | ICD-10-CM | POA: Insufficient documentation

## 2020-09-07 DIAGNOSIS — M25511 Pain in right shoulder: Secondary | ICD-10-CM

## 2020-09-07 NOTE — Patient Instructions (Addendum)
Recomiendo tomar aleve (naproxeno) segn sea necesario [employer]OK veces al da con alimentos para el dolor y la hinchazn de leves a moderados.    Sndrome de pinzamiento del hombro Shoulder Impingement Syndrome  El sndrome de pinzamiento del hombro es una afeccin que causa dolor cuando se comprimen los tejidos conjuntivos (tendones) que rodean el hombro. Estos tendones son parte de un grupo de msculos y tejidos que ayudan a Designer, fashion/clothing hombro (manguito rotador). Por detrs del Engineer, drilling, hay un saco lleno de lquido (bolsa) que permite que los msculos y los tendones se deslicen suavemente. La bolsa puede hincharse o inflamarse (bursitis). La bursitis o la hinchazn en los tendones del manguito rotador, o ambos trastornos, pueden disminuir el espacio que hay debajo del hueso en la articulacin del hombro (acromion), lo que causa pinzamiento. Cules son las causas? El sndrome de pinzamiento del hombro puede deberse a la bursitis o a la hinchazn en los tendones del manguito rotador, que se producen por lo siguiente:  Movimientos repetidos del brazo por encima del hombro.  Una cada sobre el hombro.  Debilidad de los msculos del hombro. Qu incrementa el riesgo? Tiene mayor probabilidad de sufrir esta afeccin si:  Practica deportes que requieren Heritage manager, como bisbol.  Practica deportes como tenis, vley y natacin.  Trabaja como pintor, carpintero o Occupational psychologist de frutas. Algunas personas tienen ms probabilidades de sufrir el sndrome de pinzamiento debido a la forma del hueso del acromion. Cules son los signos o los sntomas? El sntoma principal de esta afeccin es el dolor en la parte de adelante o del costado del hombro. El dolor puede tener las siguientes caractersticas:  Empeora al levantar el brazo.  Empeora por la noche.  Le interrumpe el sueo.  Es ms agudo al [employer] hombro y luego se transforma en [place]. Otros sntomas pueden  incluir:  Dolor a Insurance claims handler.  Entumecimiento.  Imposibilidad de levantar el brazo por arriba del nivel del hombro o detrs del cuerpo.  Debilidad. Cmo se diagnostica? Esta afeccin se puede diagnosticar en funcin de lo siguiente:  Los sntomas y los antecedentes mdicos.  Examen fsico.  Pruebas de diagnstico por imgenes, por ejemplo: ? Radiografas. ? Resonancia magntica (RM). ? Ecografa. Cmo se trata? El tratamiento para esta afeccin puede incluir lo siguiente:  Dietitian en reposo y Automotive engineer todas las actividades que causen dolor o requieran un esfuerzo con el hombro.  Aplicar hielo en el hombro.  Tomar antiinflamatorios no esteroideos Murriel Hopper) para ayudar a Glass blower/designer y la hinchazn.  Aplicarse una o ms inyecciones de medicamentos para anestesiar la zona y Social research officer, government.  Realizar fisioterapia.  Ciruga. Tal vez se necesite una ciruga si los tratamientos no quirrgicos no han dado resultado. La ciruga puede consistir en reparar el manguito rotador, modificar la forma del acromion o extraer la bolsa. Siga estas indicaciones en su casa: Control del dolor, el entumecimiento y la hinchazn  Si se lo indican, aplique hielo sobre la zona de la lesin. ? Ponga el hielo en una bolsa plstica. ? Coloque una [employer] piel y Copy. ? Coloque el hielo durante , 2 a 3veces por da.   Actividad  Descanse y retome sus actividades normales como se lo haya indicado el mdico. Pregntele al mdico qu actividades son seguras para usted.  Haga ejercicio como se lo haya indicado el mdico. Indicaciones generales  No consuma ningn producto que contenga nicotina o tabaco, como cigarrillos, cigarrillos electrnicos y  tabaco de mascar. Estos pueden retrasar la recuperacin. Si necesita ayuda para dejar de consumir, consulte al [employer].  Pregntele al mdico cundo es seguro volver a Science writer.  Tome los medicamentos de venta  libre y los recetados solamente como se lo haya indicado el mdico.  Oceanographer a todas las visitas de control como se lo haya indicado el mdico. Esto es importante. Cmo se evita?  Dele al cuerpo tiempo para [employer] perodos de [place] fsica.  Tome medidas de seguridad y sea responsable al hacer actividad fsica. Esto ayudar a evitar cadas.  Mantenga un buen estado fsico, lo que incluye la flexibilidad y Primary school teacher. Comunquese con un mdico si:  Los sntomas no mejoran despus de 1 a de tratamiento y reposo.  No puede levantar el brazo para alejarlo del cuerpo. Resumen  El sndrome de pinzamiento del hombro es una afeccin que causa dolor cuando se comprimen los tejidos conjuntivos (tendones) que rodean el hombro.  El sntoma principal de esta afeccin es el dolor en la parte de adelante o del costado del hombro.  Esta afeccin generalmente se trata con reposo, hielo y analgsicos segn sea necesario.    Rehabilitacin del sndrome de pinzamiento del hombro Shoulder Impingement Syndrome Rehab Pregunte al mdico qu ejercicios son seguros para usted. Haga los ejercicios exactamente como se lo haya indicado el mdico y gradelos como se lo hayan indicado. Es normal sentir un estiramiento leve, tironeo, opresin o Dentist al [employer] ejercicios. Detngase de inmediato si siente un dolor repentino o Community education officer. No comience a hacer estos ejercicios hasta que se lo indique el mdico. Ejercicio de elongacin y amplitud de movimiento Este ejercicio calienta los msculos y las articulaciones, y mejora la movilidad y la flexibilidad del hombro. Adems, ayuda a Engineer, materials y la rigidez. Aduccin horizontal pasiva En la aduccin pasiva, se [place] la otra mano para mover el brazo [employer] cuerpo. El brazo lesionado no se mueve por s solo. En este movimiento, el brazo se mueve por delante del cuerpo en el plano horizontal (aduccin  horizontal). Sintese o prese, y lleve el brazo izquierdo/derecho con el codo extendido [employer] hombro opuesto, cruzando el pecho. Detngase cuando sienta una elongacin suave atrs del hombro y la parte superior del brazo. Mantenga el brazo a la altura del hombro. Mantenga el brazo lo ms cerca que pueda de su cuerpo, sin Cabin crew. Mantenga esta posicin durante __________ segundos. Vuelva lentamente a la posicin inicial. Repita __________ veces. Realice este ejercicio __________ veces al da.   Ejercicios de fortalecimiento Estos ejercicios fortalecen el hombro y le otorgan resistencia. La resistencia es la capacidad de usar los msculos durante un tiempo prolongado, incluso despus de que se cansen. Rotacin externa, ejercicio isomtrico Este es un ejercicio en el que usted debe presionar el dorso de la mueca contra el marco de una puerta sin mover la articulacin del hombro (isomtrico). Prese o sintese en la entrada de [place], de frente al marco de la puerta. Flexione el codo izquierdo/derecho y Systems analyst de atrs de la Advice worker contra el marco de la [place]. Solo la parte de atrs de la mueca debe tocar el marco. Mantenga la parte superior del brazo al costado del cuerpo. Presione suavemente la [employer] contra el marco de la puerta, como si tratara de empujar el brazo en direccin contraria al abdomen (rotacin externa). Presione tan fuerte como pueda sin Financial risk analyst. Evite encoger el hombro mientras presiona  con la [employer] contra el marco de la puerta. Mantenga los omplatos juntos, llvelos hacia el centro de la espalda. Mantenga esta posicin durante __________ segundos. Afloje lentamente la tensin y relaje los msculos por completo antes de repetir el ejercicio. Repita __________ veces. Realice este ejercicio __________ veces al da. Rotacin interna, ejercicio isomtrico Este es un ejercicio en el que usted debe presionar la palma de la mano contra el marco  de una puerta sin mover la articulacin del hombro (isomtrico). Prese o sintese en la entrada de [place], de frente al marco de la puerta. Flexione el codo izquierdo/derecho y Immunologist palma de la mano contra el marco de la [place]. Solo la palma debe tocar el marco. Mantenga la parte superior del brazo al costado del cuerpo. Presione suavemente la mano contra el marco de la [place], como si tratara de empujar el brazo hacia el abdomen (rotacin interna). Presione tan fuerte como pueda sin Financial risk analyst. Evite encoger el hombro mientras presiona con la mano sobre el marco de la [place]. Mantenga los omplatos juntos, llvelos hacia el centro de la espalda. Mantenga esta posicin durante __________ segundos. Afloje lentamente la tensin y relaje los msculos por completo antes de repetir el ejercicio. Repita __________ veces. Realice este ejercicio __________ veces al da.   Protraccin escapular, en decbito supino [place] boca arriba sobre una superficie firme (posicin supina). Sostenga una pesa de __________ con la mano izquierda/derecha. Eleve el brazo izquierdo/derecho en el aire, de modo que la mano est directamente por encima de la articulacin del hombro. Empuje con la pesa en el aire de forma que el hombro (escpula) se levante de la superficie sobre la que est recostado. La escpula empujar hacia arriba o hacia adelante (protraccin). No mueva la cabeza, el cuello ni la espalda. Mantenga esta posicin durante __________ segundos. Vuelva lentamente a la posicin inicial. Relaje totalmente los msculos antes de repetir el ejercicio. Repita __________ veces. Realice este ejercicio __________ veces al da.   Retraccin escapular Sintese en una silla estable que no tenga apoyabrazos o pngase de pie. Ate una banda para ejercicios a un objeto estable que est frente a usted, de modo que la banda est a la altura del hombro. Sostenga un extremo de la banda para ejercicios en cada mano.  Las palmas deben estar Phoebe Sharps. Junte los omplatos Careers adviser) y [employer] codos ligeramente hacia atrs. No encoja los hombros hacia arriba al hacerlo. Mantenga esta posicin durante __________ segundos. Vuelva lentamente a la posicin inicial. Repita __________ veces. Realice este ejercicio __________ veces al da.   Extensin del hombro Sintese en una silla estable que no tenga apoyabrazos o pngase de pie. Ate una banda para ejercicios a un objeto estable que est frente a usted, de modo que la banda est por encima de la altura del hombro. Sostenga un extremo de la banda para ejercicios en cada mano. Extienda los codos y [employer] manos a la altura de los hombros. [employer] omplatos y baje las manos hacia los costados de los muslos (extensin). Detngase cuando las manos estn en la misma posicin en ambos costados. No deje que las manos vayan hacia atrs del cuerpo. Mantenga esta posicin durante __________ segundos. Vuelva lentamente a la posicin inicial. Repita __________ veces. Realice este ejercicio __________ veces al da.   Esta informacin no tiene Theme park manager el consejo del mdico. Asegrese de hacerle al mdico cualquier pregunta que tenga. Document Revised: 09/16/2018 Document Reviewed: 09/16/2018 Elsevier Patient Education  2021  [employer].

## 2020-09-12 ENCOUNTER — Ambulatory Visit: Payer: Self-pay | Admitting: Internal Medicine

## 2020-10-04 NOTE — Progress Notes (Deleted)
   Office Visit Note  Patient: Stephanie Abbott             Date of Birth: Jul 12, 1971           MRN: [id number]             PCP: Sandre Kitty, PA-C Referring: Sandre Kitty, PA-C Visit Date: 10/05/2020   Subjective:  No chief complaint on file.   History of Present Illness: Stephanie Abbott is a 50 y.o. female here for follow up ***     No Rheumatology ROS completed.   PMFS History:  Patient Active Problem List   Diagnosis Date Noted  . Pain in right shoulder 09/07/2020  . Scalp pain 09/07/2020  . Pain in left leg 09/07/2020    Past Medical History:  Diagnosis Date  . Aneurysm (HCC)   . High cholesterol   . Meningitis   . Pre-diabetes     Family History  Problem Relation Age of Onset  . Diabetes Mother   . Healthy Daughter   . Healthy Son    Past Surgical History:  Procedure Laterality Date  . CESAREAN SECTION  1999  . CESAREAN SECTION  2006  . IR 3D INDEPENDENT WKST  08/08/2020  . IR ANGIO INTRA EXTRACRAN SEL COM CAROTID INNOMINATE BILAT MOD SED  08/08/2020  . IR ANGIO VERTEBRAL SEL SUBCLAVIAN INNOMINATE UNI L MOD SED  08/08/2020  . IR ANGIO VERTEBRAL SEL VERTEBRAL UNI R MOD SED  08/08/2020  . IR US GUIDE VASC ACCESS RIGHT  08/08/2020   Social History   Social History Narrative  . Not on file   Immunization History  Administered Date(s) Administered  . PFIZER(Purple Top)SARS-COV-2 Vaccination 12/10/2019, 01/04/2020     Objective: Vital Signs: There were no vitals taken for this visit.   Physical Exam   Musculoskeletal Exam: ***  CDAI Exam: CDAI Score: - Patient Global: -; Provider Global: - Swollen: -; Tender: - Joint Exam 10/05/2020   No joint exam has been documented for this visit   There is currently no information documented on the homunculus. Go to the Rheumatology activity and complete the homunculus joint exam.  Investigation: No additional findings.  Imaging: No results found.  Recent Labs: Lab Results  Component Value Date    WBC 4.8 08/08/2020   HGB 13.6 08/08/2020   PLT 214 08/08/2020   NA 138 08/08/2020   K 3.8 08/08/2020   CL 101 08/08/2020   CO2 26 08/08/2020   GLUCOSE 101 (H) 08/08/2020   BUN 12 08/08/2020   CREATININE 0.73 08/08/2020   CALCIUM 9.3 08/08/2020    Speciality Comments: No specialty comments available.  Procedures:  No procedures performed Allergies: Patient has no known allergies.   Assessment / Plan:     Visit Diagnoses: No diagnosis found.  ***  Orders: No orders of the defined types were placed in this encounter.  No orders of the defined types were placed in this encounter.    Follow-Up Instructions: No follow-ups on file.   Fuller Plan, MD  Note - This record has been created using [employer].  Chart creation errors have been sought, but may not always  have been located. Such creation errors do not reflect on  the standard of medical care.

## 2020-10-05 ENCOUNTER — Ambulatory Visit: Payer: Self-pay | Admitting: Internal Medicine

## 2021-07-21 ENCOUNTER — Ambulatory Visit: Payer: Self-pay | Admitting: Podiatry

## 2021-07-26 ENCOUNTER — Ambulatory Visit: Payer: Self-pay | Admitting: Nurse Practitioner

## 2021-08-02 ENCOUNTER — Ambulatory Visit: Payer: Self-pay | Admitting: Podiatry

## 2021-08-16 ENCOUNTER — Ambulatory Visit (INDEPENDENT_AMBULATORY_CARE_PROVIDER_SITE_OTHER): Payer: Self-pay | Admitting: Podiatry

## 2021-08-16 ENCOUNTER — Other Ambulatory Visit: Payer: Self-pay

## 2021-08-16 ENCOUNTER — Ambulatory Visit (INDEPENDENT_AMBULATORY_CARE_PROVIDER_SITE_OTHER): Payer: Self-pay

## 2021-08-16 DIAGNOSIS — M21612 Bunion of left foot: Secondary | ICD-10-CM

## 2021-08-16 DIAGNOSIS — M21619 Bunion of unspecified foot: Secondary | ICD-10-CM

## 2021-08-16 DIAGNOSIS — Q828 Other specified congenital malformations of skin: Secondary | ICD-10-CM

## 2021-08-17 NOTE — Progress Notes (Signed)
Subjective:  Patient ID: Stephanie Abbott, female    DOB: Aug 18, 1970,  MRN: [id number]  Chief Complaint  Patient presents with   Bunions    Left foot bunion pain    51 y.o. female presents with the above complaint.  Patient presents with painful bunion to the left side as well as left submetatarsal 2 porokeratotic lesion.  Patient states both of those sites are very painful painful when ambulating.  She is a Spanish speaker the interpreter is in the room with [place] today.  She states it hurts with ambulation.  She states is been over 2 years has progressed to gotten worse.  She states her pain scale 7 out of 10 dull aching nature.  The porokeratotic lesion is extremely painful especially when she hit it side of the right way.  She has not tried anything for it.  She has tried some conservative treatment options none of which has helped.  She is not a diabetic.  She denies any other acute complaints.  Review of Systems: Negative except as noted in the HPI. Denies N/V/F/Ch.  Past Medical History:  Diagnosis Date   Aneurysm ([place])    High cholesterol    Meningitis    Pre-diabetes     Current Outpatient Medications:    Multiple Vitamin (MULTI-VITAMIN PO), Take by mouth daily., Disp: , Rfl:    Omega-3 Fatty Acids (OMEGA 3 PO), Take by mouth daily., Disp: , Rfl:    VITAMIN D PO, Take by mouth once a week., Disp: , Rfl:   Social History   Tobacco Use  Smoking Status Former   Types: Cigarettes   Quit date: 2012   Years since quitting: 11.0  Smokeless Tobacco Never    No Known Allergies Objective:  There were no vitals filed for this visit. There is no height or weight on file to calculate BMI. Constitutional Well developed. Well nourished.  Vascular Dorsalis pedis pulses palpable bilaterally. Posterior tibial pulses palpable bilaterally. Capillary refill normal to all digits.  No cyanosis or clubbing noted. Pedal hair growth normal.  Neurologic Normal speech. Oriented to person,  place, and time. Epicritic sensation to light touch grossly present bilaterally.  Dermatologic Nails well groomed and normal in appearance. No open wounds. No skin lesions.  Orthopedic: Normal joint ROM without pain or crepitus bilaterally. Hallux abductovalgus deformity present moderate bunion deformity noted to the left side.  Mild pain on palpation to the medial eminence.  No intra-articular first metatarsophalangeal joint pain.  No crepitus appreciated.  This is a track bound deformity not a tracking deformity. Left 1st MPJ diminished range of motion. Left 1st TMT without gross hypermobility. Right 1st MPJ diminished range of motion  Right 1st TMT without gross hypermobility. Lesser digital contractures absent bilaterally.  Left submetatarsal 2 porokeratotic lesion with central nucleated core noted.  Pain on palpation to the lesion.  No pinpoint bleeding noted upon debridement.  Plantarflexed second metatarsal noted.   Radiographs: Taken and reviewed. Hallux abductovalgus deformity present. Metatarsal parabola normal. 1st/2nd IMA: Moderate; TSP: 5 out of 7  Assessment:   1. Bunion   2. Porokeratosis    Plan:  Patient was evaluated and treated and all questions answered.  Hallux abductovalgus deformity, left/submetatarsal 2 porokeratotic lesion -XR as above. -I discussed all conservative treatment options including shoe gear modification protecting padding.  She would like to exhaust all conservative treatment options for now.  She will plan for getting surgery sometimes in November if shoe gear modification does not help.  At the same time she would also like to have the porokeratotic lesion/benign skin lesion excised out.  I discussed with her briefly the surgical plans.  If any foot and ankle issues arise future I will asked her to come see me in the meantime.  She states understanding.  No follow-ups on file.

## 2021-08-23 ENCOUNTER — Other Ambulatory Visit: Payer: Self-pay

## 2021-08-23 ENCOUNTER — Encounter: Payer: Self-pay | Admitting: Obstetrics & Gynecology

## 2021-08-23 ENCOUNTER — Ambulatory Visit (INDEPENDENT_AMBULATORY_CARE_PROVIDER_SITE_OTHER): Payer: Self-pay | Admitting: Obstetrics & Gynecology

## 2021-08-23 VITALS — BP 122/80 | HR 80 | Resp 16 | Ht 59.75 in | Wt 139.0 lb

## 2021-08-23 DIAGNOSIS — A630 Anogenital (venereal) warts: Secondary | ICD-10-CM

## 2021-08-23 NOTE — Progress Notes (Signed)
° ° °  Stephanie Abbott 04/27/1971 [id number]        51 y.o.  G2P2L2  Married  RP: Referred for counseling and management of Vulvar/Perianal Condyloma  HPI: Seen by family physician and found to have a small perianal condyloma.  No other condyloma.  Last Pap 10/2019 Negative.   OB History  Gravida Para Term Preterm AB Living  2         2  SAB IAB Ectopic Multiple Live Births               # Outcome Date GA Lbr Len/2nd Weight Sex Delivery Anes PTL Lv  2 Gravida           1 Gravida             Past medical history,surgical history, problem list, medications, allergies, family history and social history were all reviewed and documented in the EPIC chart.   Directed ROS with pertinent positives and negatives documented in the history of present illness/assessment and plan.  Exam:  Vitals:   08/23/21 1551  BP: 122/80  Pulse: 80  Resp: 16  Weight: 139 lb (63 kg)  Height: 4' 11.75" (1.518 m)   General appearance:  Normal  Gynecologic exam: Vulva normal.  Perianal area:  Small about 1 cm raised lesion at upper aspect of the perianal area.  Condyloma vs skin tag.  Destruction of lesion with Cryotherapy.   Assessment/Plan:  51 y.o. G2P2L2   1. Perianal condyloma acuminatum  1 raised lesion about 1 cm.  Condyloma vs Skin tag.  Cryotherapy applied for destruction of the lesion.  Post procedure precautions reviewed.  F/U Annual Gyn exam.  Genia Del MD, 4:05 PM 08/23/2021

## 2021-11-15 ENCOUNTER — Ambulatory Visit (INDEPENDENT_AMBULATORY_CARE_PROVIDER_SITE_OTHER): Payer: Self-pay | Admitting: Obstetrics & Gynecology

## 2021-11-15 ENCOUNTER — Other Ambulatory Visit (HOSPITAL_COMMUNITY)
Admission: RE | Admit: 2021-11-15 | Discharge: 2021-11-15 | Disposition: A | Discharge status: Home / Self Care | Payer: Self-pay | Source: Ambulatory Visit | Attending: Obstetrics & Gynecology | Admitting: Obstetrics & Gynecology

## 2021-11-15 ENCOUNTER — Encounter: Payer: Self-pay | Admitting: Obstetrics & Gynecology

## 2021-11-15 VITALS — BP 120/78 | Ht 59.0 in | Wt 147.0 lb

## 2021-11-15 DIAGNOSIS — Z01419 Encounter for gynecological examination (general) (routine) without abnormal findings: Secondary | ICD-10-CM

## 2021-11-15 DIAGNOSIS — Z9851 Tubal ligation status: Secondary | ICD-10-CM

## 2021-11-15 NOTE — Progress Notes (Signed)
? ? ?Stephanie Abbott 25-Jul-1971 [id number] ? ? ?History:    51 y.o. G2P2L2 Separated. Bilateral TL.  Boyfriend x 4 months ? ?RP:  Established patient presenting for annual gyn exam  ? ?HPI: Menses regular normal every month.  No pelvic pain.  No pain with IC. Seen in 07/2021 small skin tag perianally, TCA applied for possible condyloma, but no whitening.  Last Pap Neg in 2021.  Pap reflex today.  Breasts Normal.  Mammo Neg in 2022.  Has an appointment in 8-04/2022.  Urine/BMs normal.  BMI 29.69.  Walking.  Health labs with [facility]. ? ?Past medical history,surgical history, family history and social history were all reviewed and documented in the EPIC chart. ? ?Gynecologic History ?No LMP recorded. Patient is perimenopausal. ? ?Obstetric History ?OB History  ?Gravida Para Term Preterm AB Living  ?2         2  ?SAB IAB Ectopic Multiple Live Births  ?           ?  ?# Outcome Date GA Lbr Len/2nd Weight Sex Delivery Anes PTL Lv  ?2 Gravida           ?1 Gravida           ? ? ? ?ROS: A ROS was performed and pertinent positives and negatives are included in the history. ? GENERAL: No fevers or chills. HEENT: No change in vision, no earache, sore throat or sinus congestion. NECK: No pain or stiffness. CARDIOVASCULAR: No chest pain or pressure. No palpitations. PULMONARY: No shortness of breath, cough or wheeze. GASTROINTESTINAL: No abdominal pain, nausea, vomiting or diarrhea, melena or bright red blood per rectum. GENITOURINARY: No urinary frequency, urgency, hesitancy or dysuria. MUSCULOSKELETAL: No joint or muscle pain, no back pain, no recent trauma. DERMATOLOGIC: No rash, no itching, no lesions. ENDOCRINE: No polyuria, polydipsia, no heat or cold intolerance. No recent change in weight. HEMATOLOGICAL: No anemia or easy bruising or bleeding. NEUROLOGIC: No headache, seizures, numbness, tingling or weakness. PSYCHIATRIC: No depression, no loss of interest in normal activity or change in sleep pattern.  ?  ? ?Exam: ? ? ?BP  120/78 (BP Location: Right Arm, Patient Position: Sitting, Cuff Size: Normal)   Ht 4\' 11"  (1.499 m)   Wt 147 lb (66.7 kg)   BMI 29.69 kg/m?  ? ?Body mass index is 29.69 kg/m?. ? ?General appearance : Well developed well nourished female. No acute distress ?HEENT: Eyes: no retinal hemorrhage or exudates,  Neck supple, trachea midline, no carotid bruits, no thyroidmegaly ?Lungs: Clear to auscultation, no rhonchi or wheezes, or rib retractions  ?Heart: Regular rate and rhythm, no murmurs or gallops ?Breast:Examined in sitting and supine position were symmetrical in appearance, no palpable masses or tenderness,  no skin retraction, no nipple inversion, no nipple discharge, no skin discoloration, no axillary or supraclavicular lymphadenopathy ?Abdomen: no palpable masses or tenderness, no rebound or guarding ?Extremities: no edema or skin discoloration or tenderness ? ?Pelvic: Vulva: Normal ?            Vagina: No gross lesions or discharge ? Cervix: No gross lesions or discharge.  Pap reflex done. ? Uterus  AV, normal size, shape and consistency, non-tender and mobile ? Adnexa  Without masses or tenderness ? Anus: Normal ? ? ?Assessment/Plan:  51 y.o. female for annual exam  ? ?1. Encounter for routine gynecological examination with Papanicolaou smear of cervix ?Menses regular normal every month.  No pelvic pain.  No pain with IC. Seen in 07/2021 small  skin tag perianally, TCA applied for possible condyloma, but no whitening.  Last Pap Neg in 2021.  Pap reflex today.  Breasts Normal.  Mammo Neg in 2022.  Has an appointment in 8-04/2022.  Urine/BMs normal.  BMI 29.69.  Walking.  [facility] with [facility]. ?- Cytology - PAP( [place]) ? ?2. S/P tubal ligation  ? ?Princess Bruins MD, 4:13 PM 11/15/2021 ? ?  ?

## 2021-11-17 LAB — CYTOLOGY - PAP
Adequacy: ABSENT
Diagnosis: NEGATIVE

## 2021-12-12 ENCOUNTER — Telehealth: Payer: Self-pay | Admitting: Podiatry

## 2021-12-12 NOTE — Telephone Encounter (Signed)
Pt called asking for an estimate of her surgery that she is scheduled for in November. It appears she is self pay. She is asking if she could make payments or does she have to apy up front. ?She will need a Nurse, learning disability. ? ?

## 2022-02-21 ENCOUNTER — Encounter: Payer: Self-pay | Admitting: Podiatry

## 2022-03-07 ENCOUNTER — Encounter: Payer: Self-pay | Admitting: Podiatry

## 2022-05-02 ENCOUNTER — Ambulatory Visit: Payer: Self-pay | Admitting: Podiatry

## 2022-05-10 IMAGING — CT CT ANGIO HEAD
2 of 11 series · 9 of 47 positions shown · IV contrast (Omni 300)
Comparison: Noncontrast head CT 07/05/2018.

CLINICAL DATA: Neuro deficit, acute, stroke suspected; history of
possible aneurysm, multiple Od paresthesias, headache.

EXAM:
CT ANGIOGRAPHY HEAD
TECHNIQUE: Multidetector CT imaging of the head was performed using the
standard protocol during bolus administration of intravenous
contrast. Multiplanar CT image reconstructions and MIPs were
obtained to evaluate the vascular anatomy.
CONTRAST:  75mL OMNIPAQUE IOHEXOL 350 MG/ML SOLN

[Series 6: head bone · axial · 0.41mm/px · z∈[-44,+60]mm · 5 of 80 slices shown]
[im 14/80  bone]
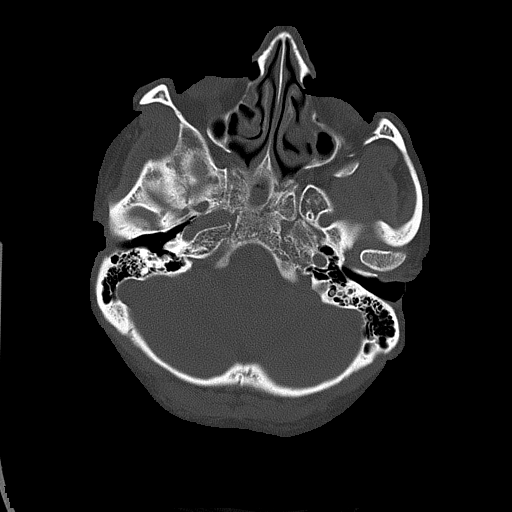
[im 27/80  bone]
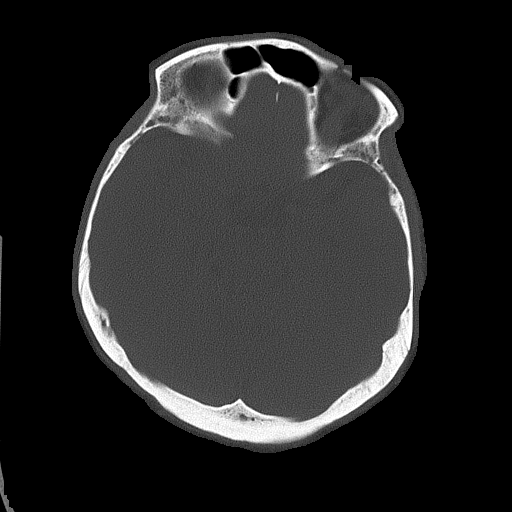
[im 40/80  bone]
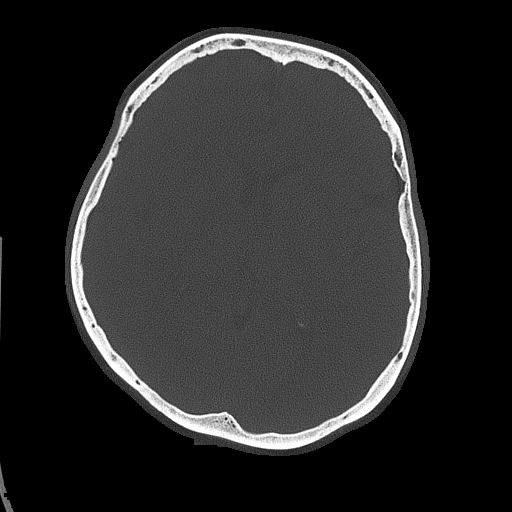
[im 53/80  bone]
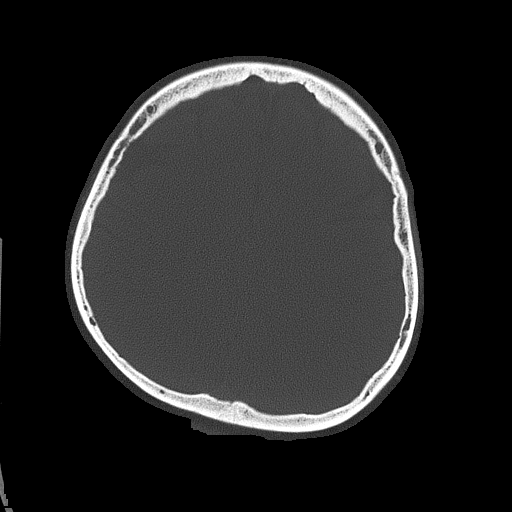
[im 66/80  bone]
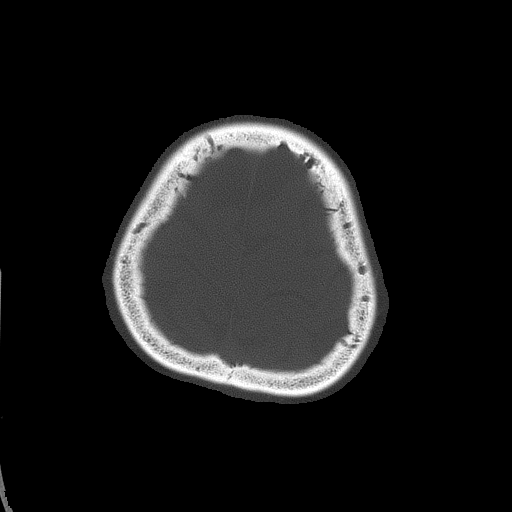

[Series 9: cow 2.0 · axial · 0.42mm/px · z∈[-38,+48]mm · 4 of 73 slices shown]
[im 15/73  brain]
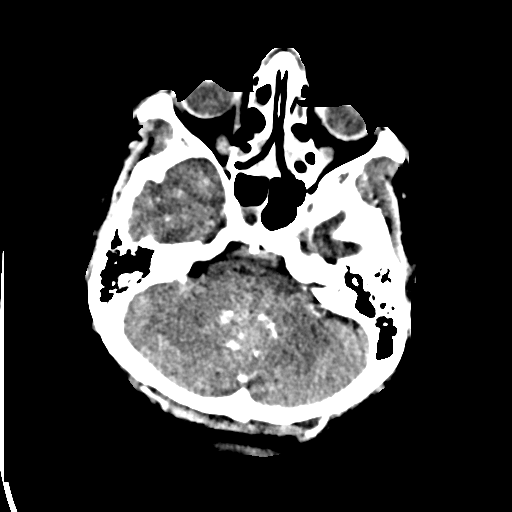
[im 29/73  bone]
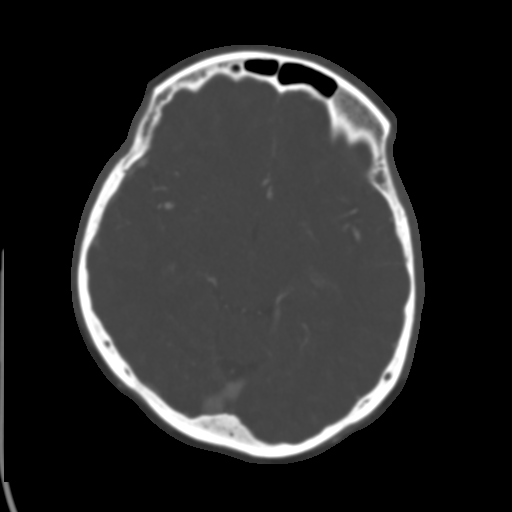
[im 44/73  brain]
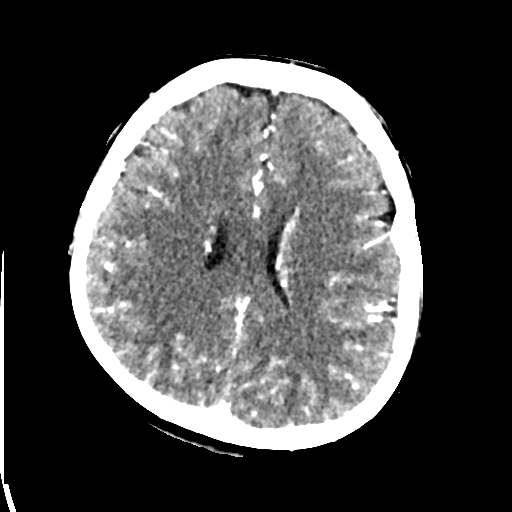
[im 58/73  bone]
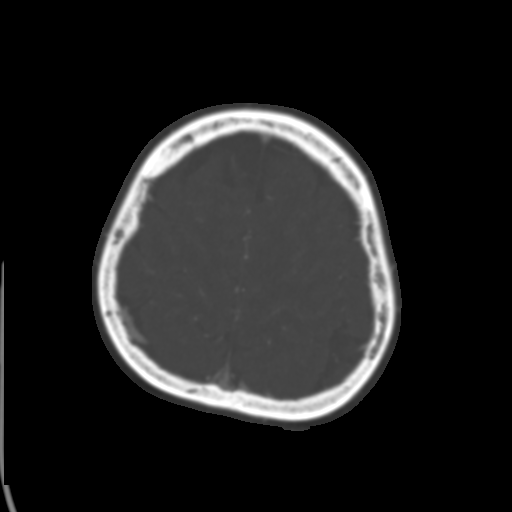

[9 of 47 positions shown; findings below may reference images not displayed]

FINDINGS: CT HEAD

Brain:

Cerebral volume is normal for age.

There is no acute intracranial hemorrhage.

No demarcated cortical infarct.

No extra-axial fluid collection.

No evidence of intracranial mass.

No midline shift.

Vascular: Redemonstrated 7 mm hyperdense focus within the anterior
aspect of the right sylvian fissure (series 5, images 12 and 13).

Skull: Normal. Negative for fracture or focal lesion.

Sinuses: Air-fluid levels within the left greater than right
maxillary sinuses. Background mild bilateral maxillary sinus mucosal
thickening. Air-fluid level within the right sphenoid sinus.
Moderate partial opacification of the bilateral ethmoid air cells.

Orbits: No mass or acute finding.

CTA HEAD

Anterior circulation:

The intracranial internal carotid arteries are patent. The M1 middle
cerebral arteries are patent. No M2 proximal branch occlusion or
high-grade proximal stenosis is identified. The anterior cerebral
arteries are patent. The 7 mm focus of hyperdensity within the
anterior right sylvian fissure is highly suspicious for an at least
partially thrombosed and calcified aneurysm arising from the right
MCA bifurcation or a proximal M2 branch. Precontrast hyperdensity
limits evaluation for contrast filling. No other intracranial
aneurysm is identified.

Posterior circulation:

The intracranial vertebral arteries are patent. The basilar artery
is patent. The posterior cerebral arteries are patent. Posterior
communicating arteries are hypoplastic or absent bilaterally.

Venous sinuses: Within the limitations of contrast timing, no
convincing thrombus.

Anatomic variants: As described
IMPRESSION: CT head:

1. No evidence of acute intracranial abnormality.
2. Bilateral maxillary and ethmoid sinusitis.

CTA head:

1. Redemonstrated 7 mm focus of hyperdensity within the anterior
right sylvian fissure. This is highly suspicious for an at least
partially thrombosed and calcified aneurysm arising from the right
MCA bifurcation or from a proximal right M2 MCA branch. Precontrast
hyperdensity limits evaluation for flow within the aneurysm.
Consider catheter-based angiography for further assessment.
2. No intracranial large vessel occlusion or proximal high-grade
arterial stenosis.

## 2022-05-13 IMAGING — US IR ANGIO INTRA EXTRACRAN SEL COM CAROTID INNOMINATE BILAT MOD SE
1 series · 2 of 2 positions shown · non-contrast
Comparison: CT angiogram of the head and neck August 05, 2020.

CLINICAL DATA: History of left-sided numbness. Workup revealed
questionable aneurysm in the right middle cerebral artery
distribution on CT angiogram of the head and neck.

EXAM:
BILATERAL COMMON CAROTID AND INNOMINATE ANGIOGRAPHY
TECHNIQUE: Informed written consent was obtained from the patient after a
thorough discussion of the procedural risks, benefits and
alternatives. All questions were addressed. Maximal Sterile Barrier
Technique was utilized including caps, mask, sterile gowns, sterile
gloves, sterile drape, hand hygiene and skin antiseptic. A timeout
was performed prior to the initiation of the procedure.

[Series 1: (id) · 2 of 2 slices shown]
[im 1/2]
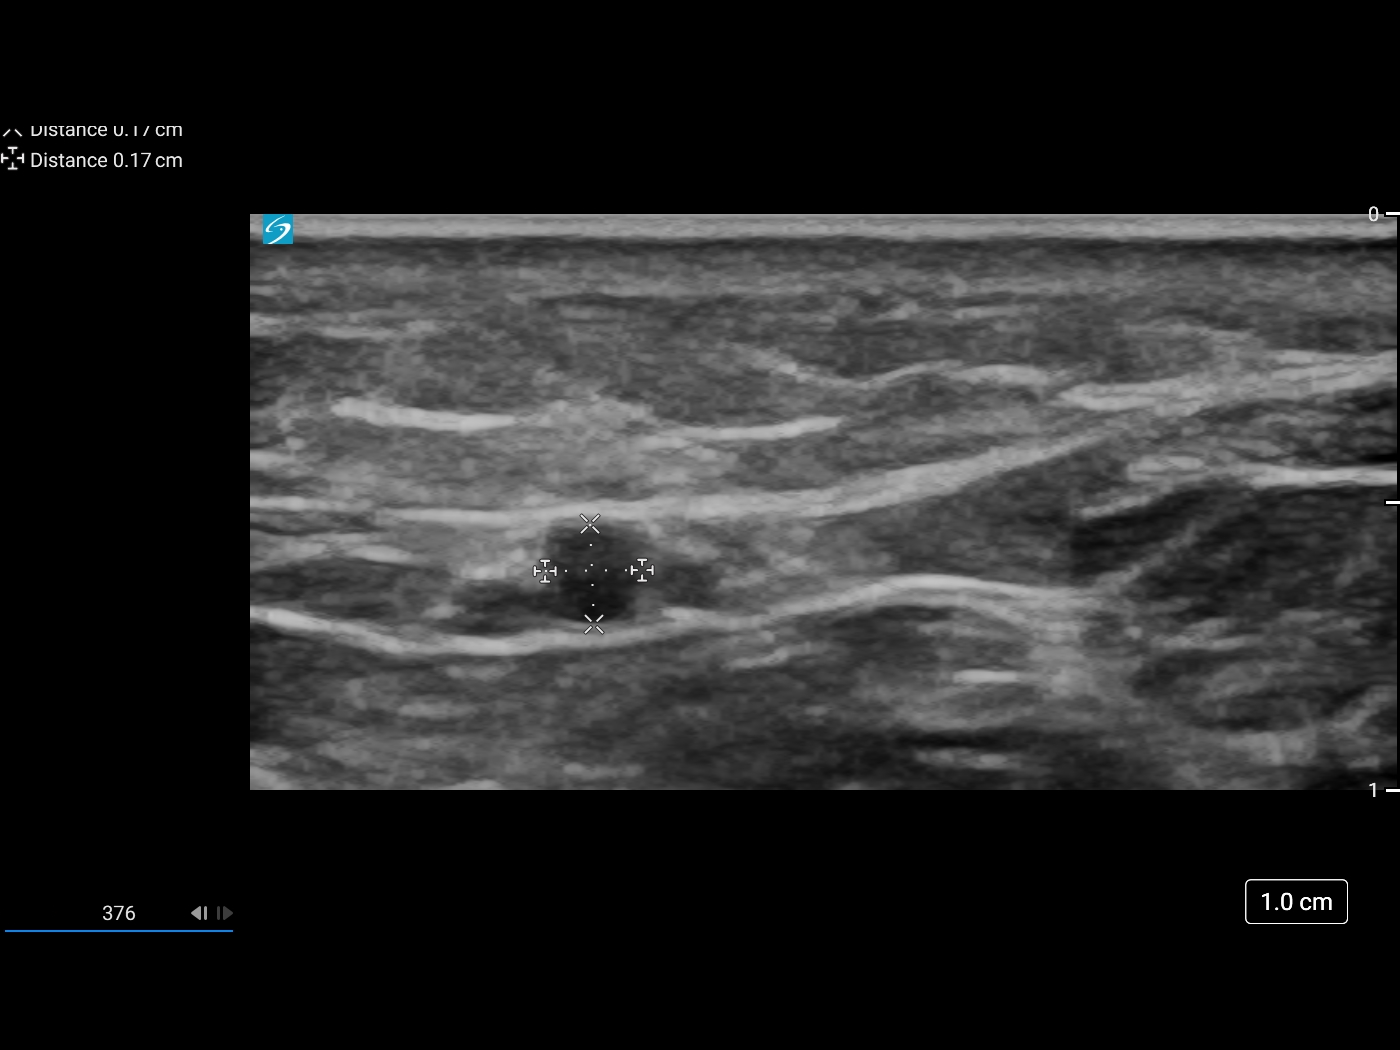
[im 2/2]
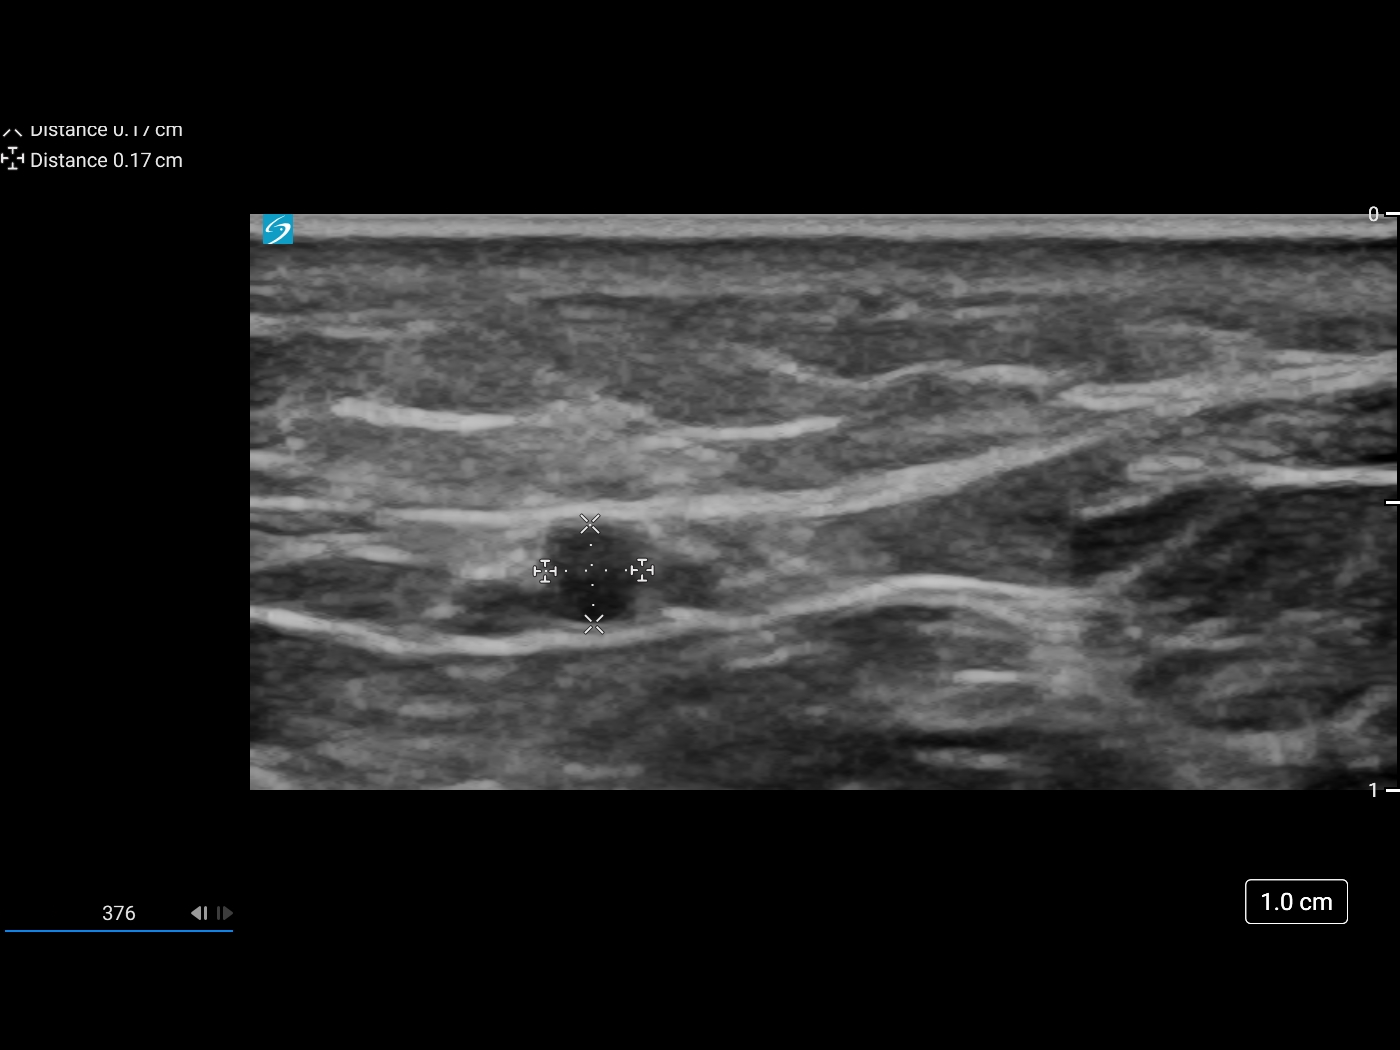

[2 of 2 positions shown; findings below may reference images not displayed]

MEDICATIONS:
Heparin 3555 units IA. No antibiotic was administered within 1 hour
of the procedure.

ANESTHESIA/SEDATION:
Versed 1 mg IV; Fentanyl 25 mcg IV

Moderate Sedation Time:  61 minutes

The patient was continuously monitored during the procedure by the
interventional radiology nurse under my direct supervision.

CONTRAST:  Isovue 300 approximately 100 mL

FLUOROSCOPY TIME:  Fluoroscopy Time: 18 minutes 48 seconds (856
mGy).

COMPLICATIONS:
None immediate.
The right forearm to the wrist was prepped and draped in the usual
sterile manner. The right radial artery was then identified with
ultrasound and its morphology documented. A dorsal palmar
anastomosis was verified to be present. Using ultrasound guidance
and micropuncture set access into the right radial artery was
obtained over a 0.018 inch micro guidewire. A [DATE] French radial
sheath was then inserted. The obturator, and the micro guidewire
were removed. Good aspiration obtained from the side port of the
sheath. A cocktail of 3666 units of heparin, 200 mcg of
nitroglycerin, and 2.5 mg of verapamil was then infused in diluted
form through the sheath without event. A right radial arteriogram
was then performed. Over a 0.035 inch Roadrunner guidewire, a 5
Hiroshi Balogun 3 diagnostic catheter was advanced to the aortic arch
region, and select cannulation was performed of the right vertebral
artery, the right common carotid artery, the left common carotid
artery and the left subclavian artery.

After the procedure, hemostasis at the right radial puncture site
was obtained with a radial band. A distal right radial pulse was
verified to be present.
FINDINGS: The right vertebral artery origin is widely patent.

The vessel opacifies to the cranial skull base. Wide patency is seen
of the right vertebrobasilar junction and the right
posterior-inferior cerebellar artery.

The basilar artery, the posterior cerebral arteries, the superior
cerebellar arteries and the anterior-inferior cerebellar arteries
opacify into the capillary and venous phases. Unopacified blood is
seen in the basilar artery from the contralateral vertebral artery.

The left vertebral artery origin is widely patent.

The vessel opacifies to the cranial skull base. Patency is seen of
the left vertebrobasilar junction and the left posterior-inferior
cerebellar artery. The opacified portion of the basilar artery, the
posterior cerebral arteries, the superior cerebellar arteries and
the anterior-inferior cerebellar arteries appear patent.

Unopacified blood is seen in the basilar artery from the
contralateral vertebral artery.

The left common carotid arteriogram demonstrates the left external
carotid artery and its major branches to be widely patent.

The petrous, the cavernous and the supraclinoid segments are widely
patent.

There is an approximately 2 mm fusiform shaped opacification in the
left posterior communicating artery region probably representing an
infundibulum. The left middle cerebral artery and the left anterior
cerebral artery opacify into the capillary and venous phases.

Transient cross-filling via the anterior communicating artery of the
right anterior cerebral A2 segment is seen.

The right common carotid arteriogram demonstrates the right external
carotid artery and its major branches to be widely patent.

The right internal carotid artery at the bulb to the cranial skull
base is also widely patent.

The petrous, the cavernous and the supraclinoid segments are widely
patent.

The right middle cerebral artery and the right anterior cerebral
artery opacify into the capillary and venous phases.

The middle cerebral artery inferior division demonstrates fusiform
dilatation involving the M2 segment. The width left of this fusiform
dilatation is 3.34 mm involving the length of approximately
mm.
IMPRESSION: Fusiform dilatation of the right middle cerebral artery inferior
division M2 segment.

PLAN:
Recommend CT angiogram of the head and neck in 1 year.

## 2022-06-20 ENCOUNTER — Encounter: Payer: Self-pay | Admitting: Podiatry

## 2022-06-27 ENCOUNTER — Encounter: Payer: Self-pay | Admitting: Podiatry

## 2022-07-04 ENCOUNTER — Encounter: Payer: Self-pay | Admitting: Podiatry

## 2022-07-11 ENCOUNTER — Encounter: Payer: Self-pay | Admitting: Podiatry

## 2022-11-21 ENCOUNTER — Ambulatory Visit: Payer: Self-pay | Admitting: Obstetrics & Gynecology

## 2022-12-31 ENCOUNTER — Ambulatory Visit: Payer: Self-pay | Admitting: Obstetrics & Gynecology

## 2023-03-27 LAB — COLOGUARD: COLOGUARD: NEGATIVE

## 2023-05-21 ENCOUNTER — Encounter: Payer: Self-pay | Admitting: Neurology

## 2023-06-13 NOTE — Progress Notes (Signed)
Initial neurology clinic note  Reason for Evaluation: Consultation requested by Hinojosa-Clapp, Marcela* for an opinion regarding paresthesias of left leg. My final recommendations will be communicated back to the requesting physician by way of shared medical record or letter to requesting physician via [place] mail.  HPI: This is Ms. Stephanie Abbott, a 52 y.o. right-handed female with a medical history of pre-diabetes, HLD, GERD, vit D deficiency, aneurysm (right MCA) who presents to neurology clinic with the chief complaint of paresthesia of left leg. The patient is accompanied by Spanish interpreter.  When patient sits down or lays down, she feels the left leg goes numb and stiff. She feels like it starts in her heel and spreads. When she gets goes to bed at night, it is difficult to sleep due to numbness. She has to get up and move. It will get a little better when she gets up. Sometimes if she changes position, this will also help. She endorses muscle cramps. She endorses back pain. She has pain in the leg (squeezing tight). Kneeling down also hurts. It is most prominent when sitting. She takes tylenol that can help a little. She does not think she has symptoms in her right leg. She denies similar symptoms in arms but does mention feeling her hands can be swollen and hard to close at times.  Patient first noticed symptoms about 3 years ago. At that time, it would come and go. Over the last 8 months, symptoms have been persistent. Per referral note (in media), TSH, HbA1c, B12, and CBC all within normal limits (I do not have these records).  Patient is on ferrous sulfate 325 mg daily and vit D 5000 international units daily. Gabapentin is mentioned in her chart, but patient does not think she has ever taken this.  She does not report any recent constitutional symptoms like fever, night sweats, anorexia or unintentional weight loss.  She is a non-smoker  EtOH use: None in last 2 years, she used to  drink heavily prior  Restrictive diet? No Family history of neuropathy/myopathy/neurologic disease? No   MEDICATIONS:  Outpatient Encounter Medications as of 06/21/2023  Medication Sig   ferrous sulfate 325 (65 FE) MG tablet Take 325 mg by mouth daily.   VITAMIN D PO Take by mouth once a week.   diclofenac (VOLTAREN) 50 MG EC tablet Take 50 mg by mouth 2 (two) times daily. (Patient not taking: Reported on 06/21/2023)   diclofenac Sodium (VOLTAREN) 1 % GEL Apply 2 g topically 4 (four) times daily. (Patient not taking: Reported on 06/21/2023)   gabapentin (NEURONTIN) 100 MG capsule Take by mouth. (Patient not taking: Reported on 06/21/2023)   Multiple Vitamin (MULTI-VITAMIN PO) Take by mouth daily. (Patient not taking: Reported on 06/21/2023)   Omega-3 Fatty Acids (OMEGA 3 PO) Take by mouth daily. (Patient not taking: Reported on 06/21/2023)   omeprazole (PRILOSEC) 40 MG capsule Take 40 mg by mouth daily. (Patient not taking: Reported on 06/21/2023)   predniSONE (STERAPRED UNI-PAK 21 TAB) 10 MG (21) TBPK tablet SMARTSIG:1 Tablet(s) By Mouth (Patient not taking: Reported on 06/21/2023)   No facility-administered encounter medications on file as of 06/21/2023.    PAST MEDICAL HISTORY: Past Medical History:  Diagnosis Date   Aneurysm (HCC)    High cholesterol    Meningitis    Pre-diabetes     PAST SURGICAL HISTORY: Past Surgical History:  Procedure Laterality Date   CESAREAN SECTION  1999   CESAREAN SECTION  2006   IR 3D  INDEPENDENT [facility]  08/08/2020   IR ANGIO INTRA EXTRACRAN SEL COM CAROTID INNOMINATE BILAT MOD SED  08/08/2020   IR ANGIO VERTEBRAL SEL SUBCLAVIAN INNOMINATE UNI L MOD SED  08/08/2020   IR ANGIO VERTEBRAL SEL VERTEBRAL UNI R MOD SED  08/08/2020   IR US GUIDE VASC ACCESS RIGHT  08/08/2020    ALLERGIES: No Known Allergies  FAMILY HISTORY: Family History  Problem Relation Age of Onset   Hypertension Mother    Diabetes Mother    Thyroid disease Son     SOCIAL  HISTORY: Social History   Tobacco Use   Smoking status: Former    Current packs/day: 0.00    Types: Cigarettes    Quit date: 2012    Years since quitting: 12.9    Passive exposure: Never   Smokeless tobacco: Never  Vaping Use   Vaping status: Never Used  Substance Use Topics   Alcohol use: Not Currently   Drug use: Not Currently   Social History   Social History Narrative   Are you right handed or left handed? RIGHT   Are you currently employed ?    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    What type of home do you live in: 1 story or 2 story? two    Caffeine 1 cup     OBJECTIVE: PHYSICAL EXAM: BP 131/82   Pulse 73   Ht 4\' 11"  (1.499 m)   Wt 157 lb (71.2 kg)   SpO2 98%   BMI 31.71 kg/m   General: General appearance: Awake and alert. No distress. Cooperative with exam.  Skin: No obvious rash or jaundice. HEENT: Atraumatic. Anicteric. Lungs: Non-labored breathing on room air  Extremities: No edema. No obvious deformity.  Musculoskeletal: No obvious joint swelling. Psych: Affect appropriate.  Neurological: Mental Status: Alert. Speech fluent. No pseudobulbar affect Cranial Nerves: CNII: No RAPD. Visual fields grossly intact. CNIII, IV, VI: PERRL. No nystagmus. EOMI. CN V: Facial sensation intact bilaterally to fine touch. CN VII: Facial muscles symmetric and strong. No ptosis at rest. CN VIII: Hearing grossly intact bilaterally. CN IX: No hypophonia. CN X: Palate elevates symmetrically. CN XI: Full strength shoulder shrug bilaterally. CN XII: Tongue protrusion full and midline. No atrophy or fasciculations. No significant dysarthria Motor: Tone is normal. No atrophy. Strength 5/5 in bilateral upper and lower extremities. Reflexes:  Right Left   Bicep 2+ 2+   Tricep 2+ 2+   BrRad 2+ 2+   Knee 2+ 2+   Ankle 2+ 2+    Pathological Reflexes: Babinski: mute response bilaterally Hoffman:  absent bilaterally Troemner: absent bilaterally Sensation: Pinprick: Mildly diminished in left lateral lower leg, otherwise intact Coordination: Intact finger-to- nose-finger bilaterally. Romberg negative. Gait: Able to rise from chair with arms crossed unassisted. Normal, narrow-based gait. Able to tandem walk. Able to walk on toes and heels.  Lab and Test Review: Internal labs: 08/08/20: BMP unremarkable CBC unremarkable  External labs: Per referral note (in media), TSH, HbA1c, B12, and CBC all within normal limits (I do not have these records).  Imaging: Cervical spine xray (07/05/18): FINDINGS: Normal alignment of the cervical vertebral bodies. No acute bony findings or abnormal prevertebral soft tissue swelling. Mild disc disease at C5-6 with mild disc space narrowing and spurring. Mild uncinate spurring changes also at this level with mild bony foraminal encroachment.   The C1-2 articulations are maintained.  The lung apices are clear.  IMPRESSION: Normal alignment and no acute bony findings.   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  CT head wo contrast (07/05/18): FINDINGS: Brain: No evidence of acute infarction, hemorrhage, hydrocephalus, extra-axial collection or mass lesion/mass effect.   Vascular: 7 mm calcification within the right anterior sylvian fissure which appears to be associated with the MCA branches.   Skull: Normal. Negative for fracture or focal lesion.   Sinuses/Orbits: Mild paranasal sinus mucosal thickening. Normal aeration of the mastoid air cells. Orbits are unremarkable.   Other: None.   IMPRESSION: 1. No acute intracranial abnormality identified. 2. Nonspecific 7 mm calcification in the right anterior sylvian fissure along course of MCA branches. This may represent a calcified aneurysm and can be further characterized with CTA or MRA of the head.  CTA head (08/05/20): CTA HEAD   Anterior circulation:   The intracranial internal  carotid arteries are patent. The M1 middle cerebral arteries are patent. No M2 proximal branch occlusion or high-grade proximal stenosis is identified. The anterior cerebral arteries are patent. 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 Precontrast hyperdensity limits evaluation for contrast filling. No other intracranial aneurysm is identified.   Posterior circulation:   The intracranial vertebral arteries are patent. The basilar artery is patent. The posterior cerebral arteries are patent. Posterior communicating arteries are hypoplastic or absent bilaterally.   Venous sinuses: Within the limitations of contrast timing, no convincing thrombus.   Anatomic variants: As described   IMPRESSION: CTA head:   1. Redemonstrated 7 mm focus of hyperdensity within the anterior right sylvian fissure. 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 Precontrast hyperdensity limits evaluation for flow within the aneurysm. Consider catheter-based angiography for further assessment. 2. No intracranial large vessel occlusion or proximal high-grade arterial stenosis.  IR angiogram (08/08/20): FINDINGS: The right vertebral artery origin is widely patent.   The vessel opacifies to the cranial skull base. Wide patency is seen of the right vertebrobasilar junction and the right posterior-inferior cerebellar artery.   The basilar artery, the posterior cerebral arteries, the superior cerebellar arteries and the anterior-inferior cerebellar arteries opacify into the capillary and venous phases. Unopacified blood is seen in the basilar artery from the contralateral vertebral artery.   The left vertebral  artery origin is widely patent.   The vessel opacifies to the cranial skull base. Patency is seen of the left vertebrobasilar junction and the left posterior-inferior cerebellar artery. The opacified portion of the basilar artery, the posterior cerebral arteries, the superior cerebellar arteries and the anterior-inferior cerebellar arteries appear patent.   Unopacified blood is seen in the basilar artery from the contralateral vertebral artery.   The left common carotid arteriogram demonstrates the left external carotid artery and its major branches to be widely patent.   The petrous, the cavernous and the supraclinoid segments are widely patent.   There is an approximately 2 mm fusiform shaped opacification in the left posterior communicating artery region probably representing an infundibulum. The left middle cerebral artery and the left anterior cerebral artery opacify into the capillary and venous phases.   Transient cross-filling via the anterior communicating artery of the right anterior cerebral A2 segment is seen.   The right common carotid arteriogram demonstrates the right external carotid  artery and its major branches to be widely patent.   The right internal carotid artery at the bulb to the cranial skull base is also widely patent.   The petrous, the cavernous and the supraclinoid segments are widely patent.   The right middle cerebral artery and the right anterior cerebral artery opacify into the capillary and venous phases.   The middle cerebral artery inferior division demonstrates fusiform dilatation involving the M2 segment. The width left of this fusiform dilatation is 3.34 mm involving the length of approximately 11.52 mm.   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   PLAN: Recommend CT angiogram of the head and neck in 1 year.   ASSESSMENT: Stephanie Abbott is a 51 y.o. female who presents for  evaluation of leg leg paresthesias. She has a relevant medical history of pre-diabetes, HLD, GERD, vit D deficiency, aneurysm (right MCA). Her neurological examination is pertinent for diminished sensation in left lateral lower leg, but otherwise unremarkable. 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 Repeat imaging was recommended in 1 year, but this has not been done. She is following up with [facility] soon for this. I do not think this is related to her current symptoms though. Restless leg syndrome is possible, but unlikely given that symptoms are only in the left leg. If symptoms are neurologic, the more likely etiology is a focal process such as lumbosacral radiculopathy. I will get an EMG to clarify.  PLAN: -Aneurysm monitoring with CTA head and neck was recommended. Patient is seeing [facility] for this on 06/26/23, so will defer imaging to them. -EMG LLE  -Return to clinic to be determined  The impression above as well as the plan as outlined below were extensively discussed with the patient (in the company of Spanish interpreter) who voiced understanding. All questions were answered to their satisfaction.  When available, results of the above investigations and possible further recommendations will be communicated to the patient via telephone/MyChart. Patient to call office if not contacted after expected testing turnaround time.   Total time spent reviewing records, interview, history/exam, documentation, and coordination of care on day of encounter:  45 min   Thank you for allowing me to participate in patient's care.  If I can answer any additional questions, I would be pleased to do so.  Jacquelyne Balint, MD   CC: Sandre Kitty, PA-C [address] [place] [address]  CC: Referring provider: Sherren Mocha, MD [address] [place],  [place] [address]

## 2023-06-21 ENCOUNTER — Ambulatory Visit (INDEPENDENT_AMBULATORY_CARE_PROVIDER_SITE_OTHER): Payer: Self-pay | Admitting: Neurology

## 2023-06-21 ENCOUNTER — Encounter: Payer: Self-pay | Admitting: Neurology

## 2023-06-21 VITALS — BP 131/82 | HR 73 | Ht 59.0 in | Wt 157.0 lb

## 2023-06-21 DIAGNOSIS — R200 Anesthesia of skin: Secondary | ICD-10-CM

## 2023-06-21 DIAGNOSIS — M79605 Pain in left leg: Secondary | ICD-10-CM

## 2023-06-21 DIAGNOSIS — M545 Low back pain, unspecified: Secondary | ICD-10-CM

## 2023-06-21 DIAGNOSIS — R202 Paresthesia of skin: Secondary | ICD-10-CM

## 2023-06-21 DIAGNOSIS — I671 Cerebral aneurysm, nonruptured: Secondary | ICD-10-CM

## 2023-06-21 NOTE — Patient Instructions (Addendum)
ELECTROMYOGRAM AND NERVE CONDUCTION STUDIES (EMG/NCS) INSTRUCTIONS  How to Prepare The neurologist conducting the EMG will need to know if you have certain medical conditions. Tell the neurologist and other EMG lab personnel if you: Have a pacemaker or any other electrical medical device Take blood-thinning medications Have hemophilia, a blood-clotting disorder that causes prolonged bleeding Bathing Take a shower or bath shortly before your exam in order to remove oils from your skin. Don't apply lotions or creams before the exam.  What to Expect You'll likely be asked to change into a hospital gown for the procedure and lie down on an examination table. The following explanations can help you understand what will happen during the exam.  Electrodes. The neurologist or a technician places surface electrodes at various locations on your skin depending on where you're experiencing symptoms. Or the neurologist may insert needle electrodes at different sites depending on your symptoms.  Sensations. The electrodes will at times transmit a tiny electrical current that you may feel as a twinge or spasm. The needle electrode may cause discomfort or pain that usually ends shortly after the needle is removed. If you are concerned about discomfort or pain, you may want to talk to the neurologist about taking a short break during the exam.  Instructions. During the needle EMG, the neurologist will assess whether there is any spontaneous electrical activity when the muscle is at rest - activity that isn't present in healthy muscle tissue - and the degree of activity when you slightly contract the muscle.  He or she will give you instructions on resting and contracting a muscle at appropriate times. Depending on what muscles and nerves the neurologist is examining, he or she may ask you to change positions during the exam.  After your EMG You may experience some temporary, minor bruising where the needle  electrode was inserted into your muscle. This bruising should fade within several days. If it persists, contact your primary care doctor.    INSTRUCCIONES PARA EL ELECTROMIOGRAMA Y LOS ESTUDIOS DE CONDUCCIN NERVIOSA (EMG/NCS)  Cmo prepararse El neurlogo que realice el EMG necesitar saber si usted tiene ciertas afecciones mdicas. Informe al neurlogo y al resto del personal del laboratorio de EMG si:  Tiene un marcapasos o cualquier otro dispositivo mdico elctrico  Toma medicamentos anticoagulantes  Tiene hemofilia, un trastorno de la coagulacin sangunea que causa sangrado prolongado [place] o bese poco antes del examen para eliminar los aceites de su piel. No se aplique lociones ni cremas antes del examen. Qu esperar Es probable que le pidan que se ponga una bata de hospital para el procedimiento y que se recueste en una mesa de examen. Las siguientes explicaciones pueden ayudarlo a comprender lo que Financial trader.  Electrodos. El neurlogo o un tcnico coloca electrodos de superficie en varias ubicaciones de su piel segn dnde experimente los sntomas. O el neurlogo puede insertar electrodos de aguja en diferentes sitios segn sus sntomas.  Sensaciones. Los electrodos transmitirn en ocasiones una pequea corriente elctrica que puede sentir como una punzada o un espasmo. El electrodo de [place] puede causar molestias o dolor que generalmente desaparecen poco despus de que se retira la aguja. Si le preocupa la incomodidad o Chief Technology Officer, puede hablar con el neurlogo sobre la posibilidad de tomar un breve descanso durante el examen.  Instrucciones. Durante la EMG con aguja, el neurlogo evaluar si hay alguna actividad elctrica espontnea cuando el msculo est en reposo (actividad que no est presente en el  tejido muscular sano) y el grado de actividad cuando contrae ligeramente el msculo. Le dar instrucciones sobre cmo descansar y Primary school teacher un msculo en los  momentos adecuados. Segn los msculos y nervios que est examinando el neurlogo, es posible que le pida que cambie de posicin durante el examen. Despus de la EMG Es posible que experimente un pequeo hematoma temporal en el lugar donde se insert el electrodo de aguja en el msculo. Este hematoma debera desaparecer en [employer]. Si persiste, comunquese con su mdico de atencin primaria.

## 2023-07-03 ENCOUNTER — Other Ambulatory Visit (HOSPITAL_COMMUNITY): Payer: Self-pay | Admitting: Neurological Surgery

## 2023-07-03 DIAGNOSIS — I671 Cerebral aneurysm, nonruptured: Secondary | ICD-10-CM

## 2023-07-12 ENCOUNTER — Ambulatory Visit (HOSPITAL_COMMUNITY)
Admission: RE | Admit: 2023-07-12 | Discharge: 2023-07-12 | Disposition: A | Discharge status: Home / Self Care | Payer: Self-pay | Source: Ambulatory Visit | Attending: Neurological Surgery | Admitting: Neurological Surgery

## 2023-07-12 DIAGNOSIS — I671 Cerebral aneurysm, nonruptured: Secondary | ICD-10-CM | POA: Diagnosis present

## 2023-07-22 ENCOUNTER — Telehealth: Payer: Self-pay | Admitting: Neurology

## 2023-07-22 ENCOUNTER — Ambulatory Visit (INDEPENDENT_AMBULATORY_CARE_PROVIDER_SITE_OTHER): Payer: Self-pay | Admitting: Neurology

## 2023-07-22 DIAGNOSIS — M79605 Pain in left leg: Secondary | ICD-10-CM

## 2023-07-22 DIAGNOSIS — R200 Anesthesia of skin: Secondary | ICD-10-CM

## 2023-07-22 DIAGNOSIS — I671 Cerebral aneurysm, nonruptured: Secondary | ICD-10-CM

## 2023-07-22 NOTE — Telephone Encounter (Signed)
Discussed the results of patient's EMG after the procedure today with help of daughter for translation (patient declined medical interpretor). EMG was normal without evidence of neuropathy or radiculopathy causing symptoms in left leg.   All questions were answered.  Jacquelyne Balint, MD [facility]

## 2023-07-22 NOTE — Procedures (Addendum)
  [facility]  [address] [place], [address]  [place], [place] [address] Tel: [phone] Fax: [phone] Test Date:  07/22/2023  Patient: Stephanie Abbott DOB: January 31, 1971 Physician: Jacquelyne Balint, MD  Sex: Female Height: 4\' 11"  Ref Phys: Jacquelyne Balint, MD  ID#: [id number]   Technician:    History: This is a 52 year old female with left leg numbness and cramping.  NCV & EMG Findings: Extensive electrodiagnostic evaluation of the left lower limb shows: Left sural and superficial peroneal/fibular sensory responses are within normal limits. Left peroneal/fibular (EDB) and tibial (AH) motor responses are within normal limits. Left H reflex is absent. There is no evidence of active or chronic motor axon loss changes affecting any of the tested muscles. Motor unit configuration and recruitment pattern is within normal limits.  Impression: This study shows no definite abnormalities. Specifically: No electrodiagnostic evidence of a large fiber sensorimotor neuropathy. No electrodiagnostic evidence of a left lumbosacral (L3-S1) radiculopathy. Absent H reflex in the setting of an otherwise normal exam is of unclear clinical significance and may be technical in nature.    ___________________________ Jacquelyne Balint, MD    Nerve Conduction Studies Motor Nerve Results    Latency Amplitude F-Lat Segment Distance CV Comment  Site (ms) Norm (mV) Norm (ms)  (cm) (m/s) Norm   Left Fibular (EDB) Motor  Ankle 2.7  < 6.0 7.2  > 2.5        Bel fib head 8.2 - 5.5 -  Bel fib head-Ankle 28 51  > 40   Pop fossa 10.0 - 5.5 -  Pop fossa-Bel fib head 9 50 -   Left Tibial (AH) Motor  Ankle 2.2  < 6.0 9.3  > 4.0        Knee 9.5 - 9.1 -  Knee-Ankle 36 49  > 40    Sensory Sites    Neg Peak Lat Amplitude (O-P) Segment Distance Velocity Comment  Site (ms) Norm (V) Norm  (cm) (ms)   Left Superficial Fibular Sensory  14 cm-Ankle 2.6  < 4.6 13  > 4 14 cm-Ankle 14    Left Sural Sensory  Calf-Lat mall  3.2  < 4.6 12  > 4 Calf-Lat mall 14     H-Reflex Results    M-Lat H Lat H Neg Amp H-M Lat  Site (ms) (ms) Norm (mV) (ms)  Left Tibial H-Reflex  Pop fossa --- *NR  < 35.0 *NR ---   Electromyography   Side Muscle Ins.Act Fibs Fasc Recrt Amp Dur Poly Activation Comment  Left Tib ant Nml Nml Nml Nml Nml Nml Nml Nml N/A  Left Gastroc MH Nml Nml Nml Nml Nml Nml Nml Nml N/A  Left Rectus fem Nml Nml Nml Nml Nml Nml Nml Nml N/A  Left Biceps fem SH Nml Nml Nml Nml Nml Nml Nml Nml N/A  Left Gluteus med Nml Nml Nml Nml Nml Nml Nml Nml N/A      Waveforms:  Motor      Sensory      H-Reflex

## 2024-07-13 ENCOUNTER — Ambulatory Visit: Payer: Self-pay | Admitting: Physical Therapy
